# Patient Record
Sex: Female | Born: 1960 | Race: Black or African American | Hispanic: No | Smoking: Current every day smoker
Health system: Southern US, Community
[De-identification: ages and names within clinical notes are randomized; demographics above are authoritative.]

## PROBLEM LIST (undated history)

## (undated) DIAGNOSIS — F1010 Alcohol abuse, uncomplicated: Secondary | ICD-10-CM

## (undated) DIAGNOSIS — I10 Essential (primary) hypertension: Secondary | ICD-10-CM

## (undated) DIAGNOSIS — Z87891 Personal history of nicotine dependence: Secondary | ICD-10-CM

## (undated) HISTORY — PX: APPENDECTOMY: SHX54

---

## 2009-09-19 ENCOUNTER — Emergency Department (HOSPITAL_COMMUNITY): Admission: EM | Admit: 2009-09-19 | Discharge: 2009-09-19 | Payer: Self-pay | Admitting: Emergency Medicine

## 2010-11-25 LAB — URINALYSIS, ROUTINE W REFLEX MICROSCOPIC
Bilirubin Urine: NEGATIVE
Glucose, UA: NEGATIVE mg/dL
Ketones, ur: NEGATIVE mg/dL
Leukocytes, UA: NEGATIVE
pH: 6 (ref 5.0–8.0)

## 2010-11-25 LAB — POCT I-STAT, CHEM 8
BUN: 6 mg/dL (ref 6–23)
Calcium, Ion: 0.9 mmol/L — ABNORMAL LOW (ref 1.12–1.32)
Glucose, Bld: 76 mg/dL (ref 70–99)
TCO2: 27 mmol/L (ref 0–100)

## 2010-11-25 LAB — CBC
HCT: 34.3 % — ABNORMAL LOW (ref 36.0–46.0)
Hemoglobin: 12.2 g/dL (ref 12.0–15.0)
MCHC: 35.5 g/dL (ref 30.0–36.0)
RDW: 18.1 % — ABNORMAL HIGH (ref 11.5–15.5)
WBC: 8.8 10*3/uL (ref 4.0–10.5)

## 2010-11-25 LAB — URINE MICROSCOPIC-ADD ON

## 2010-11-25 LAB — DIFFERENTIAL
Basophils Absolute: 0.1 10*3/uL (ref 0.0–0.1)
Lymphocytes Relative: 31 % (ref 12–46)
Lymphs Abs: 2.8 10*3/uL (ref 0.7–4.0)
Monocytes Absolute: 0.9 10*3/uL (ref 0.1–1.0)
Monocytes Relative: 10 % (ref 3–12)
Neutro Abs: 5.1 10*3/uL (ref 1.7–7.7)

## 2010-11-25 LAB — PROTIME-INR: INR: 0.87 (ref 0.00–1.49)

## 2010-11-25 LAB — GC/CHLAMYDIA PROBE AMP, URINE
Chlamydia, Swab/Urine, PCR: NEGATIVE
GC Probe Amp, Urine: NEGATIVE

## 2010-11-25 LAB — RAPID URINE DRUG SCREEN, HOSP PERFORMED
Amphetamines: NOT DETECTED
Benzodiazepines: NOT DETECTED

## 2011-10-19 ENCOUNTER — Emergency Department (HOSPITAL_COMMUNITY)
Admission: EM | Admit: 2011-10-19 | Discharge: 2011-10-19 | Disposition: A | Discharge status: Home / Self Care | Attending: Emergency Medicine | Admitting: Emergency Medicine

## 2011-10-19 ENCOUNTER — Encounter (HOSPITAL_COMMUNITY): Payer: Self-pay

## 2011-10-19 DIAGNOSIS — R590 Localized enlarged lymph nodes: Secondary | ICD-10-CM

## 2011-10-19 DIAGNOSIS — R599 Enlarged lymph nodes, unspecified: Secondary | ICD-10-CM | POA: Insufficient documentation

## 2011-10-19 DIAGNOSIS — F17200 Nicotine dependence, unspecified, uncomplicated: Secondary | ICD-10-CM | POA: Insufficient documentation

## 2011-10-19 LAB — WET PREP, GENITAL: Yeast Wet Prep HPF POC: NONE SEEN

## 2011-10-19 LAB — PREGNANCY, URINE: Preg Test, Ur: NEGATIVE

## 2011-10-19 LAB — URINALYSIS, ROUTINE W REFLEX MICROSCOPIC
Glucose, UA: NEGATIVE mg/dL
Ketones, ur: NEGATIVE mg/dL
Leukocytes, UA: NEGATIVE
Protein, ur: 100 mg/dL — AB
Urobilinogen, UA: 0.2 mg/dL (ref 0.0–1.0)

## 2011-10-19 LAB — URINE MICROSCOPIC-ADD ON

## 2011-10-19 MED ORDER — DOXYCYCLINE HYCLATE 100 MG PO CAPS: ORAL_CAPSULE | ORAL | Status: DC

## 2011-10-19 MED ORDER — HYDROCODONE-ACETAMINOPHEN 5-325 MG PO TABS: 1.0000 | ORAL_TABLET | ORAL | Status: AC | PRN

## 2011-10-19 NOTE — ED Provider Notes (Signed)
History     CSN: [id number]  Arrival date & time 10/19/11  1946   First MD Initiated Contact with Patient 10/19/11 2000      Chief Complaint  Patient presents with  . Mass    LLQ/groin area    (Consider location/radiation/quality/duration/timing/severity/associated sxs/prior treatment) HPI Comments: Patient is a 51 year old woman has a 2 day history of pain and a lump in the left groin. So has a small pimple on the left labia minorum.  Has been no fever. She has had no prior treatment for this problem. She denies vaginal discharge.  Her boyfriend made her come for evaluation.  Patient is a 51 y.o. female presenting with groin pain.  Groin Pain This is a new problem. The current episode started 2 days ago. The problem occurs constantly. The problem has not changed since onset.Pertinent negatives include no chest pain, no abdominal pain, no headaches and no shortness of breath. Exacerbated by: Palpation. The symptoms are relieved by nothing. She has tried nothing for the symptoms.    History reviewed. No pertinent past medical history.  Past Surgical History  Procedure Date  . Appendectomy     History reviewed. No pertinent family history.  History  Substance Use Topics  . Smoking status: Current Everyday Smoker  . Smokeless tobacco: Not on file  . Alcohol Use: Yes    OB History    Grav Para Term Preterm Abortions TAB SAB Ect Mult Living                  Review of Systems  Constitutional: Negative.  Negative for fever and chills.  HENT: Negative.   Eyes: Negative.   Respiratory: Negative.  Negative for shortness of breath.   Cardiovascular: Negative for chest pain.  Gastrointestinal: Negative.  Negative for abdominal pain.  Genitourinary: Negative.   Musculoskeletal: Negative.   Neurological: Negative.  Negative for headaches.  Hematological: Positive for adenopathy.  Psychiatric/Behavioral: Negative.     Allergies  Review of patient's allergies indicates no  known allergies.  Home Medications  No current outpatient prescriptions on file.  BP 136/89  Pulse 95  Temp(Src) 97.8 F (36.6 C) (Oral)  Resp 18  SpO2 99%  Physical Exam  Constitutional: She is oriented to person, place, and time. She appears well-developed and well-nourished. No distress.  HENT:  Head: Normocephalic and atraumatic.  Neck: Normal range of motion. Neck supple.  Abdominal: Soft. Bowel sounds are normal.  Genitourinary:       She has a tender lymph node in the left groin, and a tiny pimple on the left labium minorum.  There is a clear vaginal discharge.  There is no uterine or adnexal tenderness.  Musculoskeletal: Normal range of motion.  Lymphadenopathy:    She has no cervical adenopathy.  Neurological: She is alert and oriented to person, place, and time.       No sensory or motor deficit.  Skin: Skin is warm and dry.  Psychiatric: She has a normal mood and affect. Her behavior is normal.    ED Course  Procedures (including critical care time)  Rx for inguinal adenopathy with doxycycline 100 mg bid x 2 weeks, and hydrocodone-acetaminophen every 4 hours if needed for pain.    1. Inguinal adenopathy            Carleene Cooper III, MD 10/19/11 2053

## 2011-10-19 NOTE — ED Notes (Signed)
Pt states that she has "lumps" all over. Her boyfriend thinks its cancer

## 2011-10-21 LAB — GC/CHLAMYDIA PROBE AMP, GENITAL
Chlamydia, DNA Probe: NEGATIVE
GC Probe Amp, Genital: NEGATIVE

## 2011-10-21 LAB — URINE CULTURE

## 2012-03-14 ENCOUNTER — Encounter (HOSPITAL_COMMUNITY): Payer: Self-pay | Admitting: Emergency Medicine

## 2012-03-14 ENCOUNTER — Inpatient Hospital Stay (HOSPITAL_COMMUNITY)

## 2012-03-14 ENCOUNTER — Emergency Department (HOSPITAL_COMMUNITY)

## 2012-03-14 ENCOUNTER — Inpatient Hospital Stay (HOSPITAL_COMMUNITY)
Admission: EM | Admit: 2012-03-14 | Discharge: 2012-03-16 | DRG: 440 | Disposition: A | Discharge status: Home / Self Care | Attending: Internal Medicine | Admitting: Internal Medicine

## 2012-03-14 DIAGNOSIS — K852 Alcohol induced acute pancreatitis without necrosis or infection: Secondary | ICD-10-CM

## 2012-03-14 DIAGNOSIS — R1013 Epigastric pain: Secondary | ICD-10-CM

## 2012-03-14 DIAGNOSIS — F17200 Nicotine dependence, unspecified, uncomplicated: Secondary | ICD-10-CM | POA: Diagnosis present

## 2012-03-14 DIAGNOSIS — F10230 Alcohol dependence with withdrawal, uncomplicated: Secondary | ICD-10-CM | POA: Diagnosis present

## 2012-03-14 DIAGNOSIS — I10 Essential (primary) hypertension: Secondary | ICD-10-CM

## 2012-03-14 DIAGNOSIS — K859 Acute pancreatitis without necrosis or infection, unspecified: Principal | ICD-10-CM

## 2012-03-14 DIAGNOSIS — Z87891 Personal history of nicotine dependence: Secondary | ICD-10-CM

## 2012-03-14 DIAGNOSIS — R141 Gas pain: Secondary | ICD-10-CM | POA: Diagnosis present

## 2012-03-14 DIAGNOSIS — E785 Hyperlipidemia, unspecified: Secondary | ICD-10-CM

## 2012-03-14 DIAGNOSIS — K8020 Calculus of gallbladder without cholecystitis without obstruction: Secondary | ICD-10-CM | POA: Diagnosis present

## 2012-03-14 DIAGNOSIS — E876 Hypokalemia: Secondary | ICD-10-CM

## 2012-03-14 DIAGNOSIS — F1010 Alcohol abuse, uncomplicated: Secondary | ICD-10-CM

## 2012-03-14 DIAGNOSIS — Z9089 Acquired absence of other organs: Secondary | ICD-10-CM

## 2012-03-14 DIAGNOSIS — R143 Flatulence: Secondary | ICD-10-CM | POA: Diagnosis present

## 2012-03-14 DIAGNOSIS — R142 Eructation: Secondary | ICD-10-CM | POA: Diagnosis present

## 2012-03-14 DIAGNOSIS — F1020 Alcohol dependence, uncomplicated: Secondary | ICD-10-CM | POA: Diagnosis present

## 2012-03-14 HISTORY — DX: Essential (primary) hypertension: I10

## 2012-03-14 HISTORY — DX: Personal history of nicotine dependence: Z87.891

## 2012-03-14 HISTORY — DX: Alcohol abuse, uncomplicated: F10.10

## 2012-03-14 LAB — URINE MICROSCOPIC-ADD ON

## 2012-03-14 LAB — PROTIME-INR
INR: 0.84 (ref 0.00–1.49)
Prothrombin Time: 11.7 s (ref 11.6–15.2)

## 2012-03-14 LAB — URINALYSIS, ROUTINE W REFLEX MICROSCOPIC
Glucose, UA: NEGATIVE mg/dL
Ketones, ur: 15 mg/dL — AB
Nitrite: NEGATIVE
Specific Gravity, Urine: 1.016 (ref 1.005–1.030)
pH: 7.5 (ref 5.0–8.0)

## 2012-03-14 LAB — CBC
HCT: 44.1 % (ref 36.0–46.0)
Hemoglobin: 15 g/dL (ref 12.0–15.0)
MCH: 31.5 pg (ref 26.0–34.0)
MCHC: 34 g/dL (ref 30.0–36.0)
MCV: 92.6 fL (ref 78.0–100.0)
Platelets: 343 10*3/uL (ref 150–400)
RBC: 4.76 MIL/uL (ref 3.87–5.11)
RDW: 15 % (ref 11.5–15.5)
WBC: 17.5 10*3/uL — ABNORMAL HIGH (ref 4.0–10.5)

## 2012-03-14 LAB — CBC WITH DIFFERENTIAL/PLATELET
HCT: 44.3 % (ref 36.0–46.0)
Hemoglobin: 15.6 g/dL — ABNORMAL HIGH (ref 12.0–15.0)
Lymphs Abs: 1.6 10*3/uL (ref 0.7–4.0)
Monocytes Relative: 6 % (ref 3–12)
Neutro Abs: 14 10*3/uL — ABNORMAL HIGH (ref 1.7–7.7)
Neutrophils Relative %: 84 % — ABNORMAL HIGH (ref 43–77)
RBC: 4.83 MIL/uL (ref 3.87–5.11)
WBC: 16.8 10*3/uL — ABNORMAL HIGH (ref 4.0–10.5)

## 2012-03-14 LAB — COMPREHENSIVE METABOLIC PANEL
BUN: 3 mg/dL — ABNORMAL LOW (ref 6–23)
CO2: 31 mEq/L (ref 19–32)
Calcium: 10.2 mg/dL (ref 8.4–10.5)
Creatinine, Ser: 0.44 mg/dL — ABNORMAL LOW (ref 0.50–1.10)
GFR calc Af Amer: >90 mL/min (ref >90)
GFR calc non Af Amer: >90 mL/min (ref >90)
Glucose, Bld: 226 mg/dL — ABNORMAL HIGH (ref 70–99)
Potassium: 2.5 mEq/L — CL (ref 3.5–5.1)
Sodium: 138 mEq/L (ref 135–145)
Total Protein: 7.9 g/dL (ref 6.0–8.3)

## 2012-03-14 LAB — CREATININE, SERUM
Creatinine, Ser: 0.41 mg/dL — ABNORMAL LOW (ref 0.50–1.10)
GFR calc Af Amer: >90 mL/min (ref >90)
GFR calc non Af Amer: >90 mL/min (ref >90)

## 2012-03-14 LAB — MAGNESIUM: Magnesium: 1.1 mg/dL — ABNORMAL LOW (ref 1.5–2.5)

## 2012-03-14 LAB — POCT PREGNANCY, URINE: Preg Test, Ur: NEGATIVE

## 2012-03-14 MED ORDER — ONDANSETRON HCL 4 MG/2ML IJ SOLN: 4.0000 mg | Freq: Three times a day (TID) | INTRAMUSCULAR | Status: AC | PRN

## 2012-03-14 MED ORDER — CLONIDINE HCL 0.1 MG PO TABS
0.1000 mg | ORAL_TABLET | Freq: Four times a day (QID) | ORAL | Status: DC | PRN
  Filled 2012-03-14: qty 1

## 2012-03-14 MED ORDER — CHLORDIAZEPOXIDE HCL 5 MG PO CAPS
5.0000 mg | ORAL_CAPSULE | Freq: Three times a day (TID) | ORAL | Status: DC
  Administered 2012-03-14 – 2012-03-16 (×6): 5 mg via ORAL
  Filled 2012-03-14 (×5): qty 1
  Filled 2012-03-14: qty 5

## 2012-03-14 MED ORDER — HYDROMORPHONE HCL PF 1 MG/ML IJ SOLN
1.0000 mg | INTRAMUSCULAR | Status: AC | PRN
  Administered 2012-03-14: 1 mg via INTRAVENOUS
  Filled 2012-03-14: qty 1

## 2012-03-14 MED ORDER — IOHEXOL 300 MG/ML  SOLN
20.0000 mL | INTRAMUSCULAR | Status: AC
  Administered 2012-03-14 (×2): 20 mL via ORAL

## 2012-03-14 MED ORDER — ONDANSETRON HCL 4 MG/2ML IJ SOLN: 4.0000 mg | Freq: Four times a day (QID) | INTRAMUSCULAR | Status: DC | PRN

## 2012-03-14 MED ORDER — HYDROCODONE-ACETAMINOPHEN 5-325 MG PO TABS: 1.0000 | ORAL_TABLET | ORAL | Status: DC | PRN

## 2012-03-14 MED ORDER — ONDANSETRON HCL 4 MG/2ML IJ SOLN: 4.0000 mg | Freq: Once | INTRAMUSCULAR | Status: DC

## 2012-03-14 MED ORDER — FOLIC ACID 1 MG PO TABS
1.0000 mg | ORAL_TABLET | Freq: Every day | ORAL | Status: DC
  Administered 2012-03-14 – 2012-03-16 (×3): 1 mg via ORAL
  Filled 2012-03-14 (×3): qty 1

## 2012-03-14 MED ORDER — AMLODIPINE BESYLATE 5 MG PO TABS
5.0000 mg | ORAL_TABLET | Freq: Every day | ORAL | Status: DC
  Administered 2012-03-14 – 2012-03-16 (×3): 5 mg via ORAL
  Filled 2012-03-14 (×3): qty 1

## 2012-03-14 MED ORDER — MORPHINE SULFATE 2 MG/ML IJ SOLN: 2.0000 mg | INTRAMUSCULAR | Status: DC | PRN

## 2012-03-14 MED ORDER — POTASSIUM CHLORIDE 10 MEQ/100ML IV SOLN
10.0000 meq | Freq: Once | INTRAVENOUS | Status: AC
  Administered 2012-03-14: 10 meq via INTRAVENOUS
  Filled 2012-03-14: qty 100

## 2012-03-14 MED ORDER — ONDANSETRON 4 MG PO TBDP
4.0000 mg | ORAL_TABLET | Freq: Once | ORAL | Status: DC
  Filled 2012-03-14: qty 1

## 2012-03-14 MED ORDER — ENOXAPARIN SODIUM 40 MG/0.4ML ~~LOC~~ SOLN
40.0000 mg | SUBCUTANEOUS | Status: DC
  Administered 2012-03-14: 40 mg via SUBCUTANEOUS
  Filled 2012-03-14 (×3): qty 0.4

## 2012-03-14 MED ORDER — IOHEXOL 300 MG/ML  SOLN
100.0000 mL | Freq: Once | INTRAMUSCULAR | Status: AC | PRN
  Administered 2012-03-14: 100 mL via INTRAVENOUS

## 2012-03-14 MED ORDER — SODIUM CHLORIDE 0.9 % IV SOLN: INTRAVENOUS | Status: AC

## 2012-03-14 MED ORDER — LORAZEPAM 1 MG PO TABS: 1.0000 mg | ORAL_TABLET | Freq: Four times a day (QID) | ORAL | Status: DC | PRN

## 2012-03-14 MED ORDER — MORPHINE SULFATE 4 MG/ML IJ SOLN
4.0000 mg | Freq: Once | INTRAMUSCULAR | Status: DC
  Filled 2012-03-14: qty 1

## 2012-03-14 MED ORDER — ONDANSETRON HCL 4 MG PO TABS: 4.0000 mg | ORAL_TABLET | Freq: Four times a day (QID) | ORAL | Status: DC | PRN

## 2012-03-14 MED ORDER — VITAMIN B-1 100 MG PO TABS
100.0000 mg | ORAL_TABLET | Freq: Every day | ORAL | Status: DC
  Administered 2012-03-14 – 2012-03-16 (×3): 100 mg via ORAL
  Filled 2012-03-14 (×3): qty 1

## 2012-03-14 MED ORDER — LORAZEPAM 2 MG/ML IJ SOLN: 1.0000 mg | Freq: Four times a day (QID) | INTRAMUSCULAR | Status: DC | PRN

## 2012-03-14 MED ORDER — ALBUTEROL SULFATE (5 MG/ML) 0.5% IN NEBU: 2.5000 mg | INHALATION_SOLUTION | RESPIRATORY_TRACT | Status: DC | PRN

## 2012-03-14 MED ORDER — ONDANSETRON 4 MG PO TBDP
4.0000 mg | ORAL_TABLET | Freq: Once | ORAL | Status: AC
  Administered 2012-03-14: 4 mg via ORAL

## 2012-03-14 MED ORDER — POTASSIUM CHLORIDE 10 MEQ/100ML IV SOLN
10.0000 meq | INTRAVENOUS | Status: DC
  Filled 2012-03-14 (×3): qty 100

## 2012-03-14 MED ORDER — PANTOPRAZOLE SODIUM 40 MG IV SOLR
40.0000 mg | Freq: Every day | INTRAVENOUS | Status: DC
  Administered 2012-03-14 – 2012-03-16 (×3): 40 mg via INTRAVENOUS
  Filled 2012-03-14 (×3): qty 40

## 2012-03-14 MED ORDER — MORPHINE SULFATE 4 MG/ML IJ SOLN: 4.0000 mg | Freq: Once | INTRAMUSCULAR | Status: DC

## 2012-03-14 MED ORDER — METOPROLOL TARTRATE 50 MG PO TABS
50.0000 mg | ORAL_TABLET | Freq: Two times a day (BID) | ORAL | Status: DC
  Administered 2012-03-14 – 2012-03-16 (×5): 50 mg via ORAL
  Filled 2012-03-14 (×7): qty 1

## 2012-03-14 MED ORDER — POTASSIUM CHLORIDE CRYS ER 20 MEQ PO TBCR
40.0000 meq | EXTENDED_RELEASE_TABLET | Freq: Once | ORAL | Status: AC
  Administered 2012-03-14: 40 meq via ORAL
  Filled 2012-03-14: qty 2

## 2012-03-14 MED ORDER — GUAIFENESIN-DM 100-10 MG/5ML PO SYRP
5.0000 mL | ORAL_SOLUTION | ORAL | Status: DC | PRN
  Filled 2012-03-14: qty 5

## 2012-03-14 MED ORDER — POTASSIUM CHLORIDE 10 MEQ/100ML IV SOLN
10.0000 meq | INTRAVENOUS | Status: AC
  Administered 2012-03-14 (×3): 10 meq via INTRAVENOUS
  Filled 2012-03-14 (×3): qty 100

## 2012-03-14 MED ORDER — SODIUM CHLORIDE 0.9 % IV SOLN
INTRAVENOUS | Status: AC
  Administered 2012-03-14: 15:00:00 via INTRAVENOUS

## 2012-03-14 MED ORDER — HYDRALAZINE HCL 20 MG/ML IJ SOLN
10.0000 mg | Freq: Four times a day (QID) | INTRAMUSCULAR | Status: DC | PRN
  Administered 2012-03-14: 10 mg via INTRAVENOUS
  Filled 2012-03-14 (×2): qty 0.5

## 2012-03-14 MED ORDER — MORPHINE SULFATE 4 MG/ML IJ SOLN
4.0000 mg | Freq: Once | INTRAMUSCULAR | Status: AC
  Administered 2012-03-14: 4 mg via INTRAMUSCULAR

## 2012-03-14 NOTE — ED Notes (Signed)
Patient transported to Ultrasound 

## 2012-03-14 NOTE — Progress Notes (Signed)
03/14/12 1145  OTHER  CSW Follow Up Status No follow-up needed     Unit based LCSW to consulted if disposition needs are identified and/or if psychosocial issues are noted.  Dionne Milo MSW [facility]. Weekend/Social Worker [phone]

## 2012-03-14 NOTE — ED Notes (Signed)
Pt. C/o of abdominal pain since yesterday morning. Denies nvd. States it "feels like something is stabbing her and hasn't been able to eat".

## 2012-03-14 NOTE — ED Provider Notes (Signed)
History     CSN: [id number]  Arrival date & time 03/14/12  0821   First MD Initiated Contact with Patient 03/14/12 [phone]      Chief Complaint  Patient presents with  . Abdominal Pain    (Consider location/radiation/quality/duration/timing/severity/associated sxs/prior treatment) HPI History from patient. 51 year old female who presents with abdominal pain. Pain is described as sharp and stabbing in his located to the upper abdomen bilaterally, worse on the right side. It is constant and worsens with palpation or movement. No medications taken history this at home. She denies any associated nausea, vomiting, diarrhea, urinary symptoms, vaginal bleeding or discharge. She has not had fever or chills. She does report reduced appetite secondary to pain. Patient reports that she was seen at another emergency department about 3 years ago and ended up being hospitalized for possible pancreatitis. She does endorse drinking approximately 3 mixed drinks daily on a regular basis. Surgical history is pertinent for appendectomy.  Past Medical History  Diagnosis Date  . Hypertension     Past Surgical History  Procedure Date  . Appendectomy     No family history on file.  History  Substance Use Topics  . Smoking status: Current Everyday Smoker  . Smokeless tobacco: Not on file  . Alcohol Use: Yes    OB History    Grav Para Term Preterm Abortions TAB SAB Ect Mult Living                  Review of Systems  Constitutional: Positive for appetite change. Negative for fever and chills.  Respiratory: Negative for cough and shortness of breath.   Cardiovascular: Negative for chest pain.  Gastrointestinal: Positive for abdominal pain. Negative for nausea, vomiting, diarrhea, constipation, blood in stool, abdominal distention, anal bleeding and rectal pain.  Genitourinary: Negative for dysuria, vaginal bleeding and vaginal discharge.  Musculoskeletal: Negative for myalgias.  Skin: Negative for  color change and rash.  Neurological: Negative for dizziness and weakness.  All other systems reviewed and are negative.    Allergies  Review of patient's allergies indicates no known allergies.  Home Medications   Current Outpatient Rx  Name Route Sig Dispense Refill  . DOXYCYCLINE HYCLATE 100 MG PO CAPS  Take one capsule by mouth twice a day for two weeks. 28 capsule 0    BP 200/112  Pulse 87  Temp 98.2 F (36.8 C)  SpO2 96%  Physical Exam  Nursing note and vitals reviewed. Constitutional: She appears well-developed and well-nourished. No distress.  HENT:  Head: Normocephalic and atraumatic.  Eyes:       Normal appearance  Neck: Normal range of motion.  Cardiovascular: Normal rate, regular rhythm and normal heart sounds.   Pulmonary/Chest: Effort normal and breath sounds normal. She exhibits no tenderness.  Abdominal: Soft. Bowel sounds are normal. There is tenderness.       Tenderness to palpation to the epigastrium and right upper quadrant with guarding. No rebound.  Musculoskeletal: Normal range of motion.  Neurological: She is alert.  Skin: Skin is warm and dry. She is not diaphoretic.  Psychiatric: She has a normal mood and affect.    ED Course  Procedures (including critical care time)  Labs Reviewed  LIPASE, BLOOD - Abnormal; Notable for the following:    Lipase 2098 (*)     All other components within normal limits  CBC WITH DIFFERENTIAL - Abnormal; Notable for the following:    WBC 16.8 (*)     Hemoglobin 15.6 (*)  Neutrophils Relative 84 (*)     Neutro Abs 14.0 (*)     Lymphocytes Relative 10 (*)     Monocytes Absolute 1.1 (*)     All other components within normal limits  COMPREHENSIVE METABOLIC PANEL - Abnormal; Notable for the following:    Potassium 2.5 (*)     Chloride 90 (*)     Glucose, Bld 226 (*)     BUN 3 (*)     Creatinine, Ser 0.44 (*)     All other components within normal limits  URINALYSIS, ROUTINE W REFLEX MICROSCOPIC -  Abnormal; Notable for the following:    APPearance CLOUDY (*)     Hgb urine dipstick MODERATE (*)     Ketones, ur 15 (*)     Protein, ur 100 (*)     Leukocytes, UA TRACE (*)     All other components within normal limits  POCT PREGNANCY, URINE  URINE MICROSCOPIC-ADD ON   US Abdomen Complete  03/14/2012  *RADIOLOGY REPORT*  Clinical Data:  Epigastric and right upper quadrant abdominal pain. Elevated lipase.  COMPLETE ABDOMINAL ULTRASOUND  Comparison:  None.  Findings:  Gallbladder:  6 mm mobile gallstone in the gallbladder.  No gallbladder wall thickening or pericholecystic fluid.  The patient was not tender over the gallbladder.  Common bile duct:  Mildly dilated, measuring 6.0 mm in diameter proximally.  Liver:  Mildly echogenic and inhomogeneous.  IVC:  Appears normal.  Pancreas:  Mildly dilated pancreatic duct, measuring 3.1 mm in diameter.  The pancreatic head and tail are suboptimally visualized.  Spleen:  Poorly visualized, measuring 2.4 cm in length.  Right Kidney:  Normal, measuring 11.5 cm in length.  Left Kidney:  Normal, measuring 11.7 cm in length.  Abdominal aorta:  Obscured by overlying bowel gas distally.  The visualized portions are normal in caliber.  IMPRESSION:  1.  Cholelithiasis without evidence of cholecystitis. 2.  Mildly dilated common duct and pancreatic duct.  ERCP may provide useful additional information regarding the cause of the ductal dilatation. 3.  Mildly echogenic and heterogeneous liver, possibly due to steatosis. 4.  Poorly visualized pancreatic head and tail.  Pancreatitis is a possibility.  Original Report Authenticated By: Darrol Angel, M.D.     No diagnosis found.    MDM  Patient presents with epigastric abdominal pain since yesterday. No associated nausea or vomiting. She is nontoxic appearing with normal vital signs. Her lipase is quite elevated at 2098. Her LFTs are normal. She is also hypokalemic with a potassium of 2.5. Patient will be admitted for  these issues. An ultrasound shows cholelithiasis without cholecystitis and evidence of possible pancreatitis. No mention on the radiology report of any abscesses or cysts. Discussed with Dr. Thedore Mins [facility] who [facility] the patient for admission. Case discussed with PA with [facility] as well - they will consult for need for ER/MRCP.        Grant Fontana, PA-C 03/14/12 1417

## 2012-03-14 NOTE — H&P (Signed)
[facility]                                                                                    Patient Demographics  Lisa Blanchard, is a 51 y.o. female  CSN: [id number]  MRN: [id number]  DOB - December 31, 1960  Admit Date - 03/14/2012  Outpatient Primary MD for the patient is DEFAULT,PROVIDER, MD   With History of -  Past Medical History  Diagnosis Date  . Hypertension   . Alcohol abuse 03/14/2012  . HTN (hypertension) 03/14/2012  . Smoking hx 03/14/2012      Past Surgical History  Procedure Date  . Appendectomy     in for   Chief Complaint  Patient presents with  . Abdominal Pain     HPI  Lisa Blanchard  is a 51 y.o. female, with history of hypertension, ongoing alcohol and smoking abuse for which she has been counseled to quit both, who has had one episode of alcoholic pancreatitis a few years ago at a [facility] in [place]. Patient presents to the ER with a one-week history of epigastric abdominal pain which is sharp in nature and radiating to her back, worse with food better with bowel rest and pain medications, not associated with any other symptoms, pain is intermittent, came to the ER where she was diagnosed with acute pancreatitis and I was called to admit the patient. She denies fever chills, denies nausea vomiting, she does say that her appetite is less for the last 1 week, also says that pain actually got better after the initial episode one week ago but since yesterday abruptly got worse again.    Review of Systems    In addition to the HPI above,  No Fever-chills, No Headache, No changes with Vision or hearing, No problems swallowing food or Liquids, No Chest pain, Cough or Shortness of Breath, No Nausea or Vommitting, Bowel movements are regular, No Blood in stool or Urine, No dysuria, No new skin rashes or bruises, No new joints pains-aches,  No new weakness, tingling, numbness in any extremity, No recent weight gain or loss, No  polyuria, polydypsia or polyphagia, No significant Mental Stressors.  A full 10 point Review of Systems was done, except as stated above, all other Review of Systems were negative.   Social History History  Substance Use Topics  . Smoking status: Current Everyday Smoker  . Smokeless tobacco:  yes   . Alcohol Use: Yes     Family History No family history of antral titers or pancreatic cancer  Prior to Admission medications   Not on File    No Known Allergies  Physical Exam  Vitals  Blood pressure 166/97, pulse 90, temperature 98.2 F (36.8 C), resp. rate 18, SpO2 98.00%.   1. General middle-aged African American female lying in bed in NAD,    2. Normal affect and insight, Not Suicidal or Homicidal, Awake Alert, Oriented *3.  3. No F.N deficits, ALL C.Nerves Intact, Strength 5/5 all 4 extremities, Sensation intact all 4 extremities, Plantars down going.  4. Ears and Eyes appear Normal, Conjunctivae clear, PERRLA. Moist Oral Mucosa.  5. Supple Neck, No JVD, No cervical  lymphadenopathy appriciated, No Carotid Bruits.  6. Symmetrical Chest wall movement, Good air movement bilaterally, CTAB.  7. RRR, No Gallops, Rubs or Murmurs, No Parasternal Heave.  8. Positive Bowel Sounds, Abdomen Soft, Non tender, No organomegaly appriciated,       No rebound -guarding or rigidity.  9.  No Cyanosis, Normal Skin Turgor, No Skin Rash or Bruise.  10. Good muscle tone,  joints appear normal , no effusions, Normal ROM.  11. No Palpable Lymph Nodes in Neck or Axillae     Data Review  CBC  Lab 03/14/12 0851  WBC 16.8*  HGB 15.6*  HCT 44.3  PLT 359  MCV 91.7  MCH 32.3  MCHC 35.2  RDW 14.7  LYMPHSABS 1.6  MONOABS 1.1*  EOSABS 0.0  BASOSABS 0.0  BANDABS --   ------------------------------------------------------------------------------------------------------------------  Chemistries   Lab 03/14/12 0851  NA 138  K 2.5*  CL 90*  CO2 31  GLUCOSE 226*  BUN 3*    CREATININE 0.44*  CALCIUM 10.2  MG --  AST 17  ALT 8  ALKPHOS 77  BILITOT 0.3   ------------------------------------------------------------------------------------------------------------------ CrCl is unknown because there is no height on file for the current visit. ------------------------------------------------------------------------------------------------------------------ No results found for this basename: TSH,T4TOTAL,FREET3,T3FREE,THYROIDAB in the last 72 hours   Coagulation profile No results found for this basename: INR:5,PROTIME:5 in the last 168 hours ------------------------------------------------------------------------------------------------------------------- No results found for this basename: DDIMER:2 in the last 72 hours -------------------------------------------------------------------------------------------------------------------  Cardiac Enzymes No results found for this basename: CK:3,CKMB:3,TROPONINI:3,MYOGLOBIN:3 in the last 168 hours ------------------------------------------------------------------------------------------------------------------ No components found with this basename: POCBNP:3   ---------------------------------------------------------------------------------------------------------------  Urinalysis    Component Value Date/Time   COLORURINE YELLOW 03/14/2012 0951   APPEARANCEUR CLOUDY* 03/14/2012 0951   LABSPEC 1.016 03/14/2012 0951   PHURINE 7.5 03/14/2012 0951   GLUCOSEU NEGATIVE 03/14/2012 0951   HGBUR MODERATE* 03/14/2012 0951   BILIRUBINUR NEGATIVE 03/14/2012 0951   KETONESUR 15* 03/14/2012 0951   PROTEINUR 100* 03/14/2012 0951   UROBILINOGEN 0.2 03/14/2012 0951   NITRITE NEGATIVE 03/14/2012 0951   LEUKOCYTESUR TRACE* 03/14/2012 0951     Imaging results:   US Abdomen Complete  03/14/2012  *RADIOLOGY REPORT*  Clinical Data:  Epigastric and right upper quadrant abdominal pain. Elevated lipase.  COMPLETE ABDOMINAL ULTRASOUND  Comparison:   None.  Findings:  Gallbladder:  6 mm mobile gallstone in the gallbladder.  No gallbladder wall thickening or pericholecystic fluid.  The patient was not tender over the gallbladder.  Common bile duct:  Mildly dilated, measuring 6.0 mm in diameter proximally.  Liver:  Mildly echogenic and inhomogeneous.  IVC:  Appears normal.  Pancreas:  Mildly dilated pancreatic duct, measuring 3.1 mm in diameter.  The pancreatic head and tail are suboptimally visualized.  Spleen:  Poorly visualized, measuring 2.4 cm in length.  Right Kidney:  Normal, measuring 11.5 cm in length.  Left Kidney:  Normal, measuring 11.7 cm in length.  Abdominal aorta:  Obscured by overlying bowel gas distally.  The visualized portions are normal in caliber.  IMPRESSION:  1.  Cholelithiasis without evidence of cholecystitis. 2.  Mildly dilated common duct and pancreatic duct.  ERCP may provide useful additional information regarding the cause of the ductal dilatation. 3.  Mildly echogenic and heterogeneous liver, possibly due to steatosis. 4.  Poorly visualized pancreatic head and tail.  Pancreatitis is a possibility.  Original Report Authenticated By: Darrol Angel, M.D.      Assessment & Plan   1. Acute alcoholic pancreatitis - doubt there is  gallstone informant, her liver enzymes including total bilirubin are stable. She will be admitted on the medical floor, IV fluids bowel rest, narcotics for pain control, GI to see, will check a triglyceride level in the morning, doubt she will require ERCP or MRCP.   2. Ongoing alcohol and smoking abuse. Patient counseled to quit both, folic acid and thiamine supplementation, scheduled Librium along with IV Ativan for withdrawal or CIWA protocol.   3. Hypertension patient does not take any medications and is in poor control. We'll place her on Lopressor and Norvasc along with when necessary Catapres and monitor.   4. Hypokalemia will be replaced and rechecked.   DVT Prophylaxis Lovenox     AM Labs Ordered, also please review Full Orders   Admission, patients condition and plan of care including tests being ordered have been discussed with the patient  who indicates understanding and agree with the plan and Code Status.  Code Status Full  Condition Marinell Blight K M.D on 03/14/2012 at 2:19 PM  Between 7am to 7pm - Pager - [phone]  After 7pm go to [email or web address] - password [email or web address]  And look for the night coverage person covering me after hours  [facility] Office  [phone]

## 2012-03-14 NOTE — Progress Notes (Signed)
03/14/12 1144  Discharge Planning  Type of Residence Private residence  Living Arrangements Spouse/significant other  Support Systems Spouse/significant other  Do you have any problems obtaining your medications? No  Family/patient expects to be discharged to: Private residence  Once you are discharged, how will you get to your follow-up appointment? Family;Self  Expected Discharge Date 03/18/12  Case Management Consult Needed No  Social Work Consult Needed No

## 2012-03-14 NOTE — Consult Note (Signed)
Referring Provider: [facility] Primary Care Physician:  Sheila Oats, MD Primary Gastroenterologist:  none  Reason for Consultation: Pancreatitis  HPI: Lisa Blanchard is a 51 y.o. female was admitted earlier today through the emergency room after presenting with acute onset of epigastric pain. She is otherwise generally healthy with history of hypertension. Apparently she also does use alcohol on a daily basis admitting to 3 drinks per day.( her friend says one pint per day), and has done so for many years  Workup in the emergency room with upper abdominal ultrasound is positive for a 6 mm gallstone in the gallbladder no gallbladder wall thickening common bile duct is slightly dilated at 6 mm liver spelled to be in homogeneous, pancreas not well seen but the pancreatic duct is slightly elevated as well. Admitting labs show WBC of 16.8 hemoglobin 15.6 potassium was low at 2.5 liver function study tests are normal and lipase is significantly elevated at 2098 . Patient relates that she did have a mild episode of pancreatitis about 3 years ago at which time she was in [place]. It sounds like she was seen in the emergency room there given pain medication and a diagnosis but did not require admission. Patient says she has been doing well and has had no GI issues in the past 3 years until onset about one week ago of upper abdominal discomfort which was reminiscent of prior episode of pancreatitis. She says she felt better for a couple of days and then yesterday morning had onset of much more intense pain radiating into her back which she describes as "10 of 10". In the emergency room this morning she did have some nausea and vomiting though she says she is not been vomiting at home. She has not had any documented fever chills or sweats no diarrhea or melena. She says her appetite has been decreased over the past week and she has felt worse with any by mouth intake. Of note; both she and her friend states  that she has had developed abdominal swelling and distention over about the past 5 months, she has not had any change in her weight.  Patient has not had any prior history of liver disease that she is aware of and family history is negative as well   Past Medical History  Diagnosis Date  . Hypertension     Past Surgical History  Procedure Date  . Appendectomy     Prior to Admission medications   Not on File    Current Facility-Administered Medications  Medication Dose Route Frequency Provider Last Rate Last Dose  . HYDROmorphone (DILAUDID) injection 1 mg  1 mg Intravenous Q4H PRN Grant Fontana, PA-C   1 mg at 03/14/12 1320  . morphine 4 MG/ML injection 4 mg  4 mg Intramuscular Once Grant Fontana, PA-C   4 mg at 03/14/12 0911  . ondansetron (ZOFRAN-ODT) disintegrating tablet 4 mg  4 mg Oral Once Grant Fontana, PA-C   4 mg at 03/14/12 0911  . potassium chloride 10 mEq in 100 mL IVPB  10 mEq Intravenous Once Grant Fontana, PA-C   10 mEq at 03/14/12 1039  . potassium chloride 10 mEq in 100 mL IVPB  10 mEq Intravenous Q1 Hr x 4 Leroy Sea, MD      . potassium chloride SA (K-DUR,KLOR-CON) CR tablet 40 mEq  40 mEq Oral Once Grant Fontana, PA-C   40 mEq at 03/14/12 1039  . DISCONTD: morphine 4 MG/ML injection 4 mg  4 mg Intravenous Once  Grant Fontana, PA-C      . DISCONTD: morphine 4 MG/ML injection 4 mg  4 mg Intramuscular Once Grant Fontana, PA-C      . DISCONTD: ondansetron [facility]) injection 4 mg  4 mg Intravenous Once Grant Fontana, PA-C      . DISCONTD: ondansetron (ZOFRAN-ODT) disintegrating tablet 4 mg  4 mg Oral Once Grant Fontana, PA-C        Allergies as of 03/14/2012  . (No Known Allergies)    No family history on file.  History   Social History  . Marital Status: Divorced    Spouse Name: N/A    Number of Children: N/A  . Years of Education: N/A   Occupational History  . Not on file.   Social History Main Topics    . Smoking status: Current Everyday Smoker  . Smokeless tobacco: Not on file  . Alcohol Use: Yes  . Drug Use: No  . Sexually Active:    Other Topics Concern  . Not on file   Social History Narrative  . No narrative on file    Review of Systems: Pertinent positive and negative review of systems were noted in the above HPI section.  All other review of systems was otherwise negative.Marland Kitchen  Physical Exam: Vital signs in last 24 hours: Temp:  [98.2 F (36.8 C)] 98.2 F (36.8 C) (07/06 0828) Pulse Rate:  [73-90] 90  (07/06 1325) Resp:  [18] 18  (07/06 1126) BP: (166-211)/(88-120) 166/97 mmHg (07/06 1325) SpO2:  [94 %-98 %] 98 % (07/06 1325)   General:   Alert,  Well-developed, well-nourished, pleasant and cooperative in NAD Head:  Normocephalic and atraumatic. Eyes:  Sclera clear, no icterus.   Conjunctiva pink. Ears:  Normal auditory acuity. Nose:  No deformity, discharge,  or lesions. Mouth:  No deformity or lesions.   Neck:  Supple; no masses or thyromegaly. Lungs:  Clear throughout to auscultation.   No wheezes, crackles, or rhonchi. Heart:  Regular rate and rhythm; no murmurs, clicks, rubs,  or gallops. Abdomen:  Soft, protuberant and full filling though no definite fluid wave. She is tender in the epigastrium and right upper quadrant no palpable mass or palpable hepatosplenomegaly bowel sounds are present he does have guarding no rebound   Rectal:  Deferred  Msk:  Symmetrical without gross deformities. . Pulses:  Normal pulses noted. Extremities:  Without clubbing or edema. Very thin Neurologic:  Alert and  oriented x4;  grossly normal neurologically. Skin:  Intact without significant lesions or rashes.. Psych:  Alert and cooperative. Normal mood and affect.  Intake/Output from previous day:   Intake/Output this shift:    Lab Results:  Basename 03/14/12 0851  WBC 16.8*  HGB 15.6*  HCT 44.3  PLT 359   BMET  Basename 03/14/12 0851  NA 138  K 2.5*  CL 90*  CO2  31  GLUCOSE 226*  BUN 3*  CREATININE 0.44*  CALCIUM 10.2   LFT  Basename 03/14/12 0851  PROT 7.9  ALBUMIN 4.0  AST 17  ALT 8  ALKPHOS 77  BILITOT 0.3  BILIDIR --  IBILI --          Studies/Results: US Abdomen Complete  03/14/2012  *RADIOLOGY REPORT*  Clinical Data:  Epigastric and right upper quadrant abdominal pain. Elevated lipase.  COMPLETE ABDOMINAL ULTRASOUND  Comparison:  None.  Findings:  Gallbladder:  6 mm mobile gallstone in the gallbladder.  No gallbladder wall thickening or pericholecystic fluid.  The patient was not tender  over the gallbladder.  Common bile duct:  Mildly dilated, measuring 6.0 mm in diameter proximally.  Liver:  Mildly echogenic and inhomogeneous.  IVC:  Appears normal.  Pancreas:  Mildly dilated pancreatic duct, measuring 3.1 mm in diameter.  The pancreatic head and tail are suboptimally visualized.  Spleen:  Poorly visualized, measuring 2.4 cm in length.  Right Kidney:  Normal, measuring 11.5 cm in length.  Left Kidney:  Normal, measuring 11.7 cm in length.  Abdominal aorta:  Obscured by overlying bowel gas distally.  The visualized portions are normal in caliber.  IMPRESSION:  1.  Cholelithiasis without evidence of cholecystitis. 2.  Mildly dilated common duct and pancreatic duct.  ERCP may provide useful additional information regarding the cause of the ductal dilatation. 3.  Mildly echogenic and heterogeneous liver, possibly due to steatosis. 4.  Poorly visualized pancreatic head and tail.  Pancreatitis is a possibility.  Original Report Authenticated By: Darrol Angel, M.D.    IMPRESSION:  #72 51 year old female admitted today with acute pancreatitis with leukocytosis but no other worrisome parameters Liver tests are normal. At this time it is unclear whether her pancreatitis is secondary to EtOH versus gallbladder disease though suspect EtOH related  #2 new onset of abdominal distention and swelling x4-5 months-rule out ascites/malignancy #3  hypertension poorly controlled #4 hypokalemia- corrected  PLAN: #1 bowel rest, n.p.o. except ice chips #2 liberal IV fluids #3 pain control and anti-emetics #4 watch for EtOH withdrawal #5 IV Protonix every 24 hours #6 schedule for CT scan of the abdomen and pelvis for further evaluation of the pancreatitis and to rule out underlying cirrhosis/ascites (not seen on ultrasound) versus malignancy   Amy Esterwood  03/14/2012, 1:59 PM  GI ATTENDING  History, laboratories, x-rays reviewed. Patient seen and examined. Agree with above H&P. Patient presents with acute pancreatitis. Etiology likely alcohol. Has prior history of the same. Exam reveals tender epigastric area. Otherwise negative. No acute distress.. Continues to drink. Other issues include hypertension and hypokalemia. Abdominal distention and swelling of 4-5 months duration noted. No fluid on ultrasound. Question dilated pancreatic duct raised. Agree with plans for CT scan of the abdomen with attention to the pancreas to exclude occult mass. Otherwise, aggressive hydration, i correction, and pain control. I discussed with the patient the importance of complete abstinence from alcohol. She agreed. We will see.Marland KitchenMarland KitchenWill follow  Wilhemina Bonito. Eda Keys., M.D. [facility] Division of Gastroenterology

## 2012-03-15 DIAGNOSIS — E785 Hyperlipidemia, unspecified: Secondary | ICD-10-CM | POA: Diagnosis present

## 2012-03-15 LAB — LIPID PANEL
Cholesterol: 295 mg/dL — ABNORMAL HIGH (ref 0–200)
LDL Cholesterol: 200 mg/dL — ABNORMAL HIGH (ref 0–99)
VLDL: 17 mg/dL (ref 0–40)

## 2012-03-15 LAB — COMPREHENSIVE METABOLIC PANEL
Albumin: 3.3 g/dL — ABNORMAL LOW (ref 3.5–5.2)
Alkaline Phosphatase: 64 U/L (ref 39–117)
BUN: 3 mg/dL — ABNORMAL LOW (ref 6–23)
CO2: 27 mEq/L (ref 19–32)
Chloride: 97 mEq/L (ref 96–112)
GFR calc Af Amer: >90 mL/min (ref >90)
Glucose, Bld: 133 mg/dL — ABNORMAL HIGH (ref 70–99)
Potassium: 3.2 mEq/L — ABNORMAL LOW (ref 3.5–5.1)
Total Bilirubin: 0.3 mg/dL (ref 0.3–1.2)

## 2012-03-15 LAB — CBC
Hemoglobin: 13.8 g/dL (ref 12.0–15.0)
MCH: 31.4 pg (ref 26.0–34.0)
MCHC: 34.2 g/dL (ref 30.0–36.0)

## 2012-03-15 MED ORDER — POTASSIUM CHLORIDE CRYS ER 20 MEQ PO TBCR
20.0000 meq | EXTENDED_RELEASE_TABLET | Freq: Two times a day (BID) | ORAL | Status: DC
  Administered 2012-03-15 – 2012-03-16 (×3): 20 meq via ORAL
  Filled 2012-03-15 (×4): qty 1

## 2012-03-15 MED ORDER — CLONIDINE HCL 0.1 MG PO TABS
0.1000 mg | ORAL_TABLET | Freq: Two times a day (BID) | ORAL | Status: DC
  Administered 2012-03-15 – 2012-03-16 (×3): 0.1 mg via ORAL
  Filled 2012-03-15 (×4): qty 1

## 2012-03-15 MED ORDER — SIMVASTATIN 20 MG PO TABS
20.0000 mg | ORAL_TABLET | Freq: Every day | ORAL | Status: DC
  Administered 2012-03-15: 20 mg via ORAL
  Filled 2012-03-15 (×2): qty 1

## 2012-03-15 NOTE — Progress Notes (Signed)
Patient ID: Lisa Blanchard, female   DOB: 1961/07/20, 51 y.o.   MRN: [id number] [facility] Progress Note  Subjective: Feels better-pain not as severe, no vomiting except after contrast last pm.  Objective:  Vital signs in last 24 hours: Temp:  [97.5 F (36.4 C)-98.7 F (37.1 C)] 98.7 F (37.1 C) (07/07 0635) Pulse Rate:  [73-91] 80  (07/07 0635) Resp:  [18] 18  (07/07 0635) BP: (153-211)/(88-120) 153/94 mmHg (07/07 0635) SpO2:  [94 %-98 %] 96 % (07/07 0635) Weight:  [115 lb 4.8 oz (52.3 kg)-117 lb 4.6 oz (53.2 kg)] 117 lb 4.6 oz (53.2 kg) (07/07 0635) Last BM Date: 03/12/12 General:   Alert,  Well-developed,    in NAD Heart:  Regular rate and rhythm; no murmurs Pulm;clear Abdomen:  Soft, tender across upper abdomen and abdomen protuberant. Normal bowel sounds,+ guarding, and without rebound.   Extremities:  Without edema. Neurologic:  Alert and  oriented x4;  grossly normal neurologically. Psych:  Alert and cooperative. Normal mood and affect.  Intake/Output from previous day: 07/06 0701 - 07/07 0700 In: 2775 [I.V.:2475; IV Piggyback:300] Out: -  Intake/Output this shift:    Lab Results:  Lipase  Down to 1100 range  Basename 03/15/12 0658 03/14/12 1419 03/14/12 0851  WBC 15.6* 17.5* 16.8*  HGB 13.8 15.0 15.6*  HCT 40.4 44.1 44.3  PLT 340 343 359   BMET  Basename 03/15/12 0658 03/14/12 1825 03/14/12 1419 03/14/12 0851  NA 138 -- -- 138  K 3.2* 3.3* -- 2.5*  CL 97 -- -- 90*  CO2 27 -- -- 31  GLUCOSE 133* -- -- 226*  BUN 3* -- -- 3*  CREATININE 0.40* -- 0.41* 0.44*  CALCIUM 8.9 -- -- 10.2   LFT  Basename 03/15/12 0658  PROT 7.0  ALBUMIN 3.3*  AST 13  ALT 6  ALKPHOS 64  BILITOT 0.3  BILIDIR --  IBILI --   PT/INR  Basename 03/14/12 1419  LABPROT 11.7  INR 0.84    Assessment / Plan: #1 51 yo female with acute pancreatitis- CT reviewed and shows acute pancreatitis, no ductal dilation,no chronic pancreatitis-2 small incidental gallstones.  Pancreatitis is secondary to ETOH I discussed this with pt this morning, and advised complete abstinence, and involvement in AA on discharge. Would  also consider an inpt program if her insurance will cover- ask social worker to see  Management is supportive, she has no worrisome parameters- allow sips of clears today #2 Hypokalemia - replace, check phosphorus if not done #3 htn   we will sign off ,call for problems.  Principal Problem:  *Alcohol abuse Active Problems:  Acute alcoholic pancreatitis  Smoking hx  HTN (hypertension)  Hypokalemia  Abdominal pain, epigastric     LOS: 1 day   Amy Esterwood  03/15/2012, 10:20 AM    GI ATTENDING  Patient seen and examined. Laboratories and CT scan reviewed. Laboratory stable. Still a bit hypokalemic. Primary service correcting. CT scan shows interstitial pancreatitis. No complicating features. No mass. She is feeling significantly better. Exam has improved. Continue with hydration, correction of potassium, and advance diet as tolerated. Pain control as needed. COMPLETE ABSTINENCE FROM ALCOHOL HAS BEEN STRONGLY EMPHASIZED. We're available as needed. Thank you. Will sign off.  Wilhemina Bonito. Eda Keys., M.D. [facility] Division of Gastroenterology

## 2012-03-15 NOTE — ED Provider Notes (Signed)
Medical screening examination/treatment/procedure(s) were conducted as a shared visit with non-physician practitioner(s) and myself.  I personally evaluated the patient during the encounter  C/O abdominal pain. +epigastric ttp. No R/G. Found to have pancreatitis with dilatation of pancreatic duct. GI consulted. Admitted to hosp.  Forbes Cellar, MD 03/15/12 [phone]

## 2012-03-15 NOTE — Progress Notes (Signed)
[facility] PROGRESS NOTE  Lisa Blanchard OZH:[id number] DOB: 1961/04/22 DOA: 03/14/2012 PCP: Sheila Oats, MD  HPI/Subjective: Abdominal Pain better today, still on clears, IV fluids. CIWA is normal  Objective: Filed Vitals:   03/15/12 0020 03/15/12 0210 03/15/12 0400 03/15/12 0635  BP: 160/94 153/88  153/94  Pulse: 91 81 82 80  Temp:  98.3 F (36.8 C)  98.7 F (37.1 C)  TempSrc:  Oral  Oral  Resp:  18  18  Height:      Weight:    53.2 kg (117 lb 4.6 oz)  SpO2:  94%  96%    Intake/Output Summary (Last 24 hours) at 03/15/12 1247 Last data filed at 03/15/12 0700  Gross per 24 hour  Intake   2775 ml  Output      0 ml  Net   2775 ml    Exam:   General:  Weak appearing but NAD  Cardiovascular: RRR, no mrg  Respiratory: CTAB  Abdomen: mild TTP, mildly distended/tympanic, has BS  Data Reviewed: Basic Metabolic Panel:  Lab 03/15/12 6295 03/14/12 1825 03/14/12 1419 03/14/12 0851  NA 138 -- -- 138  K 3.2* 3.3* -- 2.5*  CL 97 -- -- 90*  CO2 27 -- -- 31  GLUCOSE 133* -- -- 226*  BUN 3* -- -- 3*  CREATININE 0.40* -- 0.41* 0.44*  CALCIUM 8.9 -- -- 10.2  MG -- 1.1* -- --  PHOS -- -- -- --   Liver Function Tests:  Lab 03/15/12 0658 03/14/12 0851  AST 13 17  ALT 6 8  ALKPHOS 64 77  BILITOT 0.3 0.3  PROT 7.0 7.9  ALBUMIN 3.3* 4.0    Lab 03/15/12 0658 03/14/12 0851  LIPASE 1127* 2098*  AMYLASE -- --   No results found for this basename: AMMONIA:5 in the last 168 hours CBC:  Lab 03/15/12 0658 03/14/12 1419 03/14/12 0851  WBC 15.6* 17.5* 16.8*  NEUTROABS -- -- 14.0*  HGB 13.8 15.0 15.6*  HCT 40.4 44.1 44.3  MCV 91.8 92.6 91.7  PLT 340 343 359      Studies: Ct Abdomen Pelvis W Wo Contrast  03/14/2012  *RADIOLOGY REPORT*  Clinical Data: Acute pancreatitis., elevated lipase and white blood cell count  CT ABDOMEN AND PELVIS WITHOUT AND WITH CONTRAST  Technique:  Multidetector CT imaging of the abdomen and pelvis was performed without contrast  material in one or both body regions, followed by contrast material(s) and further sections in one or both body regions.  Contrast: OMNIPAQUE IOHEXOL 300 MG/ML  SOLN  Comparison: Abdominal ultrasound 03/14/2012  Findings: Mild linear atelectasis at the posterior lung bases.  No pleural fluid.  No pericardial fluid.  There is no focal hepatic lesion.  No intrahepatic or extrahepatic biliary ductal dilatation.  There are two small gallstones within the gallbladder measuring less than 5 mm.  No evidence of gallbladder inflammation.  The pancreas is edematous and there is fluid along the pancreas head, body and tail.  This fluid extends along the left and right anterior pararenal fascia and into the pelvis.  These findings are consistent acute pancreatitis.  Pancreatic parenchyma enhances uniformly.  There is no organized fluid collection.  No evidence of vascular complication involving the celiac or SMA arteries.  The common bile duct is not identified.  No evidence of common bile duct stone.  The spleen is small.  The adrenal glands and kidneys are normal. The stomach, duodenum, small bowel, and cecum normal.  There are surgical clips in  the inferior margin of the cecum which may indicate prior appendectomy.  The colon is predominately collapsed.  Abdominal aorta is normal caliber.  No retroperitoneal or periportal lymphadenopathy.  There is moderate amount of free fluid the pelvis which has simple attenuation.  The ovaries and uterus are normal.  Bladder is normal.  No pelvic lymphadenopathy. Review of  bone windows demonstrates no aggressive osseous lesions.  IMPRESSION:  1.  Acute pancreatitis.  No organized fluid collection or vascular complication.  No evidence of necrosis. 2.  No evidence of common bile duct stone or ductal dilatation to suggest gallstone pancreatitis although there are several small gallstones in the gallbladder.  Original Report Authenticated By: Genevive Bi, M.D.   US Abdomen  Complete  03/14/2012  *RADIOLOGY REPORT*  Clinical Data:  Epigastric and right upper quadrant abdominal pain. Elevated lipase.  COMPLETE ABDOMINAL ULTRASOUND  Comparison:  None.  Findings:  Gallbladder:  6 mm mobile gallstone in the gallbladder.  No gallbladder wall thickening or pericholecystic fluid.  The patient was not tender over the gallbladder.  Common bile duct:  Mildly dilated, measuring 6.0 mm in diameter proximally.  Liver:  Mildly echogenic and inhomogeneous.  IVC:  Appears normal.  Pancreas:  Mildly dilated pancreatic duct, measuring 3.1 mm in diameter.  The pancreatic head and tail are suboptimally visualized.  Spleen:  Poorly visualized, measuring 2.4 cm in length.  Right Kidney:  Normal, measuring 11.5 cm in length.  Left Kidney:  Normal, measuring 11.7 cm in length.  Abdominal aorta:  Obscured by overlying bowel gas distally.  The visualized portions are normal in caliber.  IMPRESSION:  1.  Cholelithiasis without evidence of cholecystitis. 2.  Mildly dilated common duct and pancreatic duct.  ERCP may provide useful additional information regarding the cause of the ductal dilatation. 3.  Mildly echogenic and heterogeneous liver, possibly due to steatosis. 4.  Poorly visualized pancreatic head and tail.  Pancreatitis is a possibility.  Original Report Authenticated By: Darrol Angel, M.D.    Scheduled Meds:   . sodium chloride   Intravenous STAT  . amLODipine  5 mg Oral Daily  . chlordiazePOXIDE  5 mg Oral TID  . enoxaparin (LOVENOX) injection  40 mg Subcutaneous Q24H  . folic acid  1 mg Oral Daily  . iohexol  20 mL Oral Q1 Hr x 2  . metoprolol tartrate  50 mg Oral BID  . pantoprazole (PROTONIX) IV  40 mg Intravenous Q1200  . potassium chloride  10 mEq Intravenous Q1 Hr x 3  . thiamine  100 mg Oral Daily  . DISCONTD: potassium chloride  10 mEq Intravenous Q1 Hr x 4   Continuous Infusions:   . sodium chloride 150 mL/hr at 03/14/12 1430     Assessment/Plan: 1. Acute  Pancreatitis *Improving, CT shows no complications of pancreatitis, etiology ETOH *Discussed alcohol addiction, offered support *Continue IV fluids, not requiring pain meds *Advance diet today. *FLP shows hyperlipidemia  2. ETOH Abuse *resources Given *continue CIWA, possible W/D day 2-3  3. HTN *3 drug regimen, Norvasc, Metoprolol, prn clonidine * Will schedule clonidine-may help with ETOH w/d  4. Hyperlipidemia * LDL 200 will start a statin  5. Hypokalemia *will check a Mag level *daily repletion * IV and PO KCl today  Code Status: Full Code Family Communication: Care discussed with patient at bedside Disposition Plan: Home tomorrow if she tolerates her diet and K is normal   Anderson Malta, MD  [facility] Pager [phone]  If 7PM-7AM, please contact  night-coverage [email or web address] Password [email or web address] 03/15/2012, 12:47 PM   LOS: 1 day

## 2012-03-16 LAB — CBC
Platelets: 349 10*3/uL (ref 150–400)
RBC: 4.15 MIL/uL (ref 3.87–5.11)
WBC: 13.3 10*3/uL — ABNORMAL HIGH (ref 4.0–10.5)

## 2012-03-16 LAB — BASIC METABOLIC PANEL
CO2: 28 mEq/L (ref 19–32)
Chloride: 97 mEq/L (ref 96–112)
Sodium: 139 mEq/L (ref 135–145)

## 2012-03-16 LAB — PHOSPHORUS: Phosphorus: 3.8 mg/dL (ref 2.3–4.6)

## 2012-03-16 MED ORDER — FOLIC ACID 1 MG PO TABS: 1.0000 mg | ORAL_TABLET | Freq: Every day | ORAL | Status: AC

## 2012-03-16 MED ORDER — MAGNESIUM SULFATE 40 MG/ML IJ SOLN
2.0000 g | Freq: Once | INTRAMUSCULAR | Status: AC
  Administered 2012-03-16: 2 g via INTRAVENOUS
  Filled 2012-03-16: qty 50

## 2012-03-16 MED ORDER — THIAMINE HCL 100 MG PO TABS: 100.0000 mg | ORAL_TABLET | Freq: Every day | ORAL | Status: AC

## 2012-03-16 MED ORDER — SIMVASTATIN 20 MG PO TABS: 20.0000 mg | ORAL_TABLET | Freq: Every day | ORAL | Status: DC

## 2012-03-16 MED ORDER — METOPROLOL SUCCINATE ER 100 MG PO TB24: 100.0000 mg | ORAL_TABLET | Freq: Every day | ORAL | Status: DC

## 2012-03-16 MED ORDER — AMLODIPINE BESYLATE 5 MG PO TABS: 5.0000 mg | ORAL_TABLET | Freq: Every day | ORAL | Status: AC

## 2012-03-16 MED ORDER — POTASSIUM CHLORIDE CRYS ER 20 MEQ PO TBCR
40.0000 meq | EXTENDED_RELEASE_TABLET | Freq: Once | ORAL | Status: AC
  Administered 2012-03-16: 40 meq via ORAL
  Filled 2012-03-16: qty 2

## 2012-03-16 MED ORDER — HYDROCODONE-ACETAMINOPHEN 5-500 MG PO CAPS: 1.0000 | ORAL_CAPSULE | Freq: Four times a day (QID) | ORAL | Status: AC | PRN

## 2012-03-16 MED ORDER — CLONIDINE HCL 0.2 MG PO TABS
0.2000 mg | ORAL_TABLET | Freq: Four times a day (QID) | ORAL | Status: DC | PRN
  Filled 2012-03-16: qty 1

## 2012-03-16 MED ORDER — POTASSIUM CHLORIDE CRYS ER 20 MEQ PO TBCR: 40.0000 meq | EXTENDED_RELEASE_TABLET | Freq: Every day | ORAL | Status: DC

## 2012-03-16 MED ORDER — CALCIUM-MAGNESIUM 300-300 MG PO TABS: 1.0000 | ORAL_TABLET | Freq: Every day | ORAL | Status: DC

## 2012-03-16 MED ORDER — OMEPRAZOLE 20 MG PO CPDR: 20.0000 mg | DELAYED_RELEASE_CAPSULE | Freq: Every day | ORAL | Status: AC

## 2012-03-16 MED ORDER — CLONIDINE HCL 0.2 MG PO TABS: 0.2000 mg | ORAL_TABLET | Freq: Two times a day (BID) | ORAL | Status: DC

## 2012-03-16 MED ORDER — CHLORDIAZEPOXIDE HCL 5 MG PO CAPS: 5.0000 mg | ORAL_CAPSULE | Freq: Every day | ORAL | Status: AC

## 2012-03-16 NOTE — Progress Notes (Signed)
Per MD Anderson Malta, Ok for pt to have tablets of Lorcet-HD instead of capsules. Called back to pharmD Jasmine December at [phone] ([facility])

## 2012-03-16 NOTE — Progress Notes (Signed)
Lisa Blanchard to be D/C'd Home per MD order.  Discussed with the patient and all questions fully answered.   [name], [name]  Home Medication Instructions [id number]   Printed on:03/16/12 1452  Medication Information                    amLODipine (NORVASC) 5 MG tablet Take 1 tablet (5 mg total) by mouth daily.           cloNIDine (CATAPRES) 0.2 MG tablet Take 1 tablet (0.2 mg total) by mouth 2 (two) times daily.           folic acid (FOLVITE) 1 MG tablet Take 1 tablet (1 mg total) by mouth daily.           potassium chloride SA (K-DUR,KLOR-CON) 20 MEQ tablet Take 2 tablets (40 mEq total) by mouth daily.           simvastatin (ZOCOR) 20 MG tablet Take 1 tablet (20 mg total) by mouth daily at 6 PM.           thiamine 100 MG tablet Take 1 tablet (100 mg total) by mouth daily.           chlordiazePOXIDE (LIBRIUM) 5 MG capsule Take 1 capsule (5 mg total) by mouth at bedtime.           hydrocodone-acetaminophen (LORCET-HD) 5-500 MG per capsule Take 1 capsule by mouth every 6 (six) hours as needed for pain.           omeprazole (PRILOSEC) 20 MG capsule Take 1 capsule (20 mg total) by mouth daily.           metoprolol succinate (TOPROL XL) 100 MG 24 hr tablet Take 1 tablet (100 mg total) by mouth daily. Take with or immediately following a meal.           Calcium-Magnesium (CALCIUM MAGNESIUM 750) 300-300 MG TABS Take 1 tablet by mouth daily.             VVS, Skin clean, dry and intact without evidence of skin break down, no evidence of skin tears noted. IV catheter discontinued intact. Site without signs and symptoms of complications. Dressing and pressure applied.  An After Visit Summary was printed and given to the patient. Patient escorted via WC, and D/C home via private auto.  Kennyth Arnold D 03/16/2012 2:52 PM

## 2012-03-16 NOTE — Progress Notes (Addendum)
Clinical Social Worker received referral in [place] for other psychosocial needs. Clinical Social Worker reviewed chart and noted ED Clinical Social Worker screened pt and no social work needs identified at that time and no social work needs identified during admission. No standing order in EPIC. Pt discharge home. Clinical Social Worker signed off.  Jacklynn Lewis, MSW, LCSWA  Clinical Social Work [phone]

## 2012-04-05 NOTE — Discharge Summary (Addendum)
Physician Discharge Summary  Lisa Blanchard MWU:[id number] DOB: 1960/09/29 DOA: 03/14/2012  PCP: Sheila Oats, MD  Admit date: 03/14/2012 Discharge date: 03/16/2012  Recommendations for Outpatient Follow-up:  F/U with PCP  Discharge Diagnoses:  Principal Problem:  *Acute alcoholic pancreatitis Active Problems:  Alcohol abuse  HTN (hypertension)  Hypokalemia  Hyperlipidemia  Smoking hx  Abdominal pain, epigastric  Discharge Condition:  Stable improved.  Diet recommendation: Heart Healthy  Wt Readings from Last 3 Encounters:  03/16/12 54.2 kg (119 lb 7.8 oz)    History of present illness:  Lisa Blanchard is a 50 y.o. female, with history of hypertension, ongoing alcohol and smoking who has had one episode of alcoholic pancreatitis a few years ago at a [facility] in [place]. Patient presented to the ER with a one-week history of epigastric abdominal pain which is sharp in nature and radiating to her back, worse with food better with bowel rest and pain medications, not associated with any other symptoms, pain was intermittent, came to the ER where she was diagnosed with acute pancreatitis.  Hospital Course:  Admitted for symptomatic management of acute pancreatitis. Aggressive IV hydration, NPO, pain control Tolerating PO at discharge Was placed on CIWA, no withdrawal Thiamine and folate given.  Acute Pancreatitis *Improved, CT shows no complications of pancreatitis, etiology ETOH  *Discussed alcohol addiction, offered support  *Continue IV fluids, not requiring pain meds  *Advanced diet  *FLP shows hyperlipidemia   2. ETOH Abuse  *resources Given   3. HTN  *3 drug regimen, Norvasc, Metoprolol, prn clonidine  * Will schedule clonidine-may help with ETOH w/d   4. Hyperlipidemia  * LDL 200 will start a statin   5. Hypokalemia  *will check a Mag level  *daily repletion  * IV and PO KCl today   Discharge Exam: Filed Vitals:   03/16/12 1433  BP: 114/69    Pulse: 78  Temp: 97.9 F (36.6 C)  Resp: 18   Filed Vitals:   03/15/12 1400 03/15/12 2127 03/16/12 0518 03/16/12 1433  BP: 169/96 134/82 142/86 114/69  Pulse: 84 73 72 78  Temp: 98.5 F (36.9 C) 98.3 F (36.8 C) 97.8 F (36.6 C) 97.9 F (36.6 C)  TempSrc: Oral Oral Oral Oral  Resp: 18 16 17 18   Height:      Weight:   54.2 kg (119 lb 7.8 oz)   SpO2: 98% 97% 97% 98%   General appearance: NAD, conversant  Eyes: anicteric sclerae, moist conjunctivae; no lid-lag; PERRLA HENT: Atraumatic; oropharynx clear with moist mucous membranes and no mucosal ulcerations; normal hard and soft palate Neck: Trachea midline; FROM, supple, no thyromegaly or lymphadenopathy Lungs: CTA, with normal respiratory effort and no intercostal retractions CV: RRR, no MRGs  Abdomen: Soft, non-tender; no masses or HSM Extremities: No peripheral edema or extremity lymphadenopathy Skin: Normal temperature, turgor and texture; no rash, ulcers or subcutaneous nodules Psych: Appropriate affect, alert and oriented to person, place and time    Discharge Instructions  Discharge Orders    Future Orders Please Complete By Expires   Diet - low sodium heart healthy      Increase activity slowly      Discharge instructions      Comments:   1. Follow-up with PCP in 1 week for potassium check 2. Your vitamins/electrolyes potassium, magnesium, calcium were low so you have been put on several replacement medications. As your nutritional status improved these can be discontinued after seeing your PCP. 3. Please join AA and stop drinking so  you can live a long healthy life.     Medication List  As of 04/05/2012  5:34 PM   TAKE these medications         amLODipine 5 MG tablet   Commonly known as: NORVASC   Take 1 tablet (5 mg total) by mouth daily.      Calcium-Magnesium 300-300 MG Tabs   Take 1 tablet by mouth daily.      chlordiazePOXIDE 5 MG capsule   Commonly known as: LIBRIUM   Take 1 capsule (5 mg total)  by mouth at bedtime.      cloNIDine 0.2 MG tablet   Commonly known as: CATAPRES   Take 1 tablet (0.2 mg total) by mouth 2 (two) times daily.      folic acid 1 MG tablet   Commonly known as: FOLVITE   Take 1 tablet (1 mg total) by mouth daily.      metoprolol succinate 100 MG 24 hr tablet   Commonly known as: TOPROL-XL   Take 1 tablet (100 mg total) by mouth daily. Take with or immediately following a meal.      omeprazole 20 MG capsule   Commonly known as: PRILOSEC   Take 1 capsule (20 mg total) by mouth daily.      potassium chloride SA 20 MEQ tablet   Commonly known as: K-DUR,KLOR-CON   Take 2 tablets (40 mEq total) by mouth daily.      simvastatin 20 MG tablet   Commonly known as: ZOCOR   Take 1 tablet (20 mg total) by mouth daily at 6 PM.      thiamine 100 MG tablet   Take 1 tablet (100 mg total) by mouth daily.           Follow-up Information    Schedule an appointment as soon as possible for a visit with [facility]. (Need blood work to check potassium level)    Contact information:   [address] [place] [address]           The results of significant diagnostics from this hospitalization (including imaging, microbiology, ancillary and laboratory) are listed below for reference.    Significant Diagnostic Studies: Ct Abdomen Pelvis W Wo Contrast  03/14/2012  *RADIOLOGY REPORT*  Clinical Data: Acute pancreatitis., elevated lipase and white blood cell count  CT ABDOMEN AND PELVIS WITHOUT AND WITH CONTRAST  Technique:  Multidetector CT imaging of the abdomen and pelvis was performed without contrast material in one or both body regions, followed by contrast material(s) and further sections in one or both body regions.  Contrast: OMNIPAQUE IOHEXOL 300 MG/ML  SOLN  Comparison: Abdominal ultrasound 03/14/2012  Findings: Mild linear atelectasis at the posterior lung bases.  No pleural fluid.  No pericardial fluid.  There is  no focal hepatic lesion.  No intrahepatic or extrahepatic biliary ductal dilatation.  There are two small gallstones within the gallbladder measuring less than 5 mm.  No evidence of gallbladder inflammation.  The pancreas is edematous and there is fluid along the pancreas head, body and tail.  This fluid extends along the left and right anterior pararenal fascia and into the pelvis.  These findings are consistent acute pancreatitis.  Pancreatic parenchyma enhances uniformly.  There is no organized fluid collection.  No evidence of vascular complication involving the celiac or SMA arteries.  The common bile duct is not identified.  No evidence of common bile duct stone.  The spleen is small.  The adrenal glands and kidneys are normal. The stomach, duodenum, small bowel, and cecum normal.  There are surgical clips in the inferior margin of the cecum which may indicate prior appendectomy.  The colon is predominately collapsed.  Abdominal aorta is normal caliber.  No retroperitoneal or periportal lymphadenopathy.  There is moderate amount of free fluid the pelvis which has simple attenuation.  The ovaries and uterus are normal.  Bladder is normal.  No pelvic lymphadenopathy. Review of  bone windows demonstrates no aggressive osseous lesions.  IMPRESSION:  1.  Acute pancreatitis.  No organized fluid collection or vascular complication.  No evidence of necrosis. 2.  No evidence of common bile duct stone or ductal dilatation to suggest gallstone pancreatitis although there are several small gallstones in the gallbladder.  Original Report Authenticated By: Genevive Bi, M.D.   US Abdomen Complete  03/14/2012  *RADIOLOGY REPORT*  Clinical Data:  Epigastric and right upper quadrant abdominal pain. Elevated lipase.  COMPLETE ABDOMINAL ULTRASOUND  Comparison:  None.  Findings:  Gallbladder:  6 mm mobile gallstone in the gallbladder.  No gallbladder wall thickening or pericholecystic fluid.  The patient was not tender over  the gallbladder.  Common bile duct:  Mildly dilated, measuring 6.0 mm in diameter proximally.  Liver:  Mildly echogenic and inhomogeneous.  IVC:  Appears normal.  Pancreas:  Mildly dilated pancreatic duct, measuring 3.1 mm in diameter.  The pancreatic head and tail are suboptimally visualized.  Spleen:  Poorly visualized, measuring 2.4 cm in length.  Right Kidney:  Normal, measuring 11.5 cm in length.  Left Kidney:  Normal, measuring 11.7 cm in length.  Abdominal aorta:  Obscured by overlying bowel gas distally.  The visualized portions are normal in caliber.  IMPRESSION:  1.  Cholelithiasis without evidence of cholecystitis. 2.  Mildly dilated common duct and pancreatic duct.  ERCP may provide useful additional information regarding the cause of the ductal dilatation. 3.  Mildly echogenic and heterogeneous liver, possibly due to steatosis. 4.  Poorly visualized pancreatic head and tail.  Pancreatitis is a possibility.  Original Report Authenticated By: Darrol Angel, M.D.    Microbiology: No results found for this or any previous visit (from the past 240 hour(s)).   Labs: Basic Metabolic Panel:   Lab  03/15/12 0658  03/14/12 1825  03/14/12 1419  03/14/12 0851   NA  138  --  --  138   K  3.2*  3.3*  --  2.5*   CL  97  --  --  90*   CO2  27  --  --  31   GLUCOSE  133*  --  --  226*   BUN  3*  --  --  3*   CREATININE  0.40*  --  0.41*  0.44*   CALCIUM  8.9  --  --  10.2   MG  --  1.1*  --  --   PHOS  --  --  --  --    Liver Function Tests:   Lab  03/15/12 0658  03/14/12 0851   AST  13  17   ALT  6  8   ALKPHOS  64  77   BILITOT  0.3  0.3   PROT  7.0  7.9   ALBUMIN  3.3*  4.0     Lab  03/15/12 0658  03/14/12 0851   LIPASE  1127*  2098*   AMYLASE  --  --    No results found  for this basename: AMMONIA:5 in the last 168 hours  CBC:   Lab  03/15/12 0658  03/14/12 1419  03/14/12 0851   WBC  15.6*  17.5*  16.8*   NEUTROABS  --  --  14.0*   HGB  13.8  15.0  15.6*   HCT  40.4  44.1   44.3   MCV  91.8  92.6  91.7   PLT  340  343  359         Time coordinating discharge: 35 minutes  Signed:  GOLDING,ELIZABETH  [facility] 04/05/2012, 5:34 PM

## 2016-07-31 ENCOUNTER — Other Ambulatory Visit: Payer: Self-pay

## 2016-07-31 ENCOUNTER — Encounter (HOSPITAL_COMMUNITY): Payer: Self-pay | Admitting: Emergency Medicine

## 2016-07-31 ENCOUNTER — Emergency Department (HOSPITAL_COMMUNITY)
Admission: EM | Admit: 2016-07-31 | Discharge: 2016-07-31 | Disposition: A | Discharge status: Home / Self Care | Attending: Physician Assistant | Admitting: Physician Assistant

## 2016-07-31 ENCOUNTER — Telehealth: Payer: Self-pay | Admitting: *Deleted

## 2016-07-31 DIAGNOSIS — Z79899 Other long term (current) drug therapy: Secondary | ICD-10-CM | POA: Diagnosis not present

## 2016-07-31 DIAGNOSIS — I10 Essential (primary) hypertension: Secondary | ICD-10-CM | POA: Diagnosis not present

## 2016-07-31 DIAGNOSIS — N644 Mastodynia: Secondary | ICD-10-CM | POA: Diagnosis not present

## 2016-07-31 DIAGNOSIS — F17200 Nicotine dependence, unspecified, uncomplicated: Secondary | ICD-10-CM | POA: Diagnosis not present

## 2016-07-31 NOTE — ED Triage Notes (Signed)
Pt states she has been having shooting pains in bilateral breasts for a while. No other symptoms associated with it

## 2016-07-31 NOTE — ED Notes (Signed)
EDP aware of increased BP. Pt encouraged to take medicines and follow up with her PCP.

## 2016-07-31 NOTE — Telephone Encounter (Signed)
Pt and significant other walled in ER stating her PCP will not see her as she has not been in their office in 10 years.  EDCM consulted to assist with getting pt PCP.  EDCM called [facility] to see if they were accepting new patients and if they accepted pt insurance.  Receptionist confirmed acceptance of new pts and pt insurance.  EDCM gave pt printed out [facility] along with telephone number and address.  Pt states she will visit the office today to set up an appointment.

## 2016-07-31 NOTE — ED Provider Notes (Addendum)
[facility]-EMERGENCY DEPT Provider Note   CSN: [id number] Arrival date & time: 07/31/16  [id number]     History   Chief Complaint Chief Complaint  Patient presents with  . Breast Pain    HPI Lisa Blanchard is a 55 y.o. female.  HPI  She is a [age] her old the nail presenting with bilateral breast pain for last 2 months. Patient reports that she occasionally has aching occasionally shooting pain in her breast. She has not noticed any lumps. She cannot get with her primary until January. She's not had a mammography for number of years.  She has gone through menopause. She's noticed no color changes in her breast. No cough no shortness breath and fevers. \ Past Medical History:  Diagnosis Date  . Alcohol abuse 03/14/2012  . HTN (hypertension) 03/14/2012  . Hypertension   . Smoking hx 03/14/2012    Patient Active Problem List   Diagnosis Date Noted  . Hyperlipidemia 03/15/2012  . Acute alcoholic pancreatitis 03/14/2012  . Alcohol abuse 03/14/2012  . Smoking hx 03/14/2012  . HTN (hypertension) 03/14/2012  . Hypokalemia 03/14/2012  . Abdominal pain, epigastric 03/14/2012    Past Surgical History:  Procedure Laterality Date  . APPENDECTOMY      OB History    No data available       Home Medications    Prior to Admission medications   Medication Sig Start Date End Date Taking? Authorizing Provider  amLODipine (NORVASC) 5 MG tablet Take 1 tablet (5 mg total) by mouth daily. 03/16/12 03/16/13  Edsel PetrinElizabeth L Golding, DO  Calcium-Magnesium (CALCIUM MAGNESIUM 750) 300-300 MG TABS Take 1 tablet by mouth daily. Patient not taking: Reported on 07/31/2016 03/16/12   Edsel PetrinElizabeth L Golding, DO  cloNIDine (CATAPRES) 0.2 MG tablet Take 1 tablet (0.2 mg total) by mouth 2 (two) times daily. Patient not taking: Reported on 07/31/2016 03/16/12 03/16/13  Edsel PetrinElizabeth L Golding, DO  metoprolol succinate (TOPROL XL) 100 MG 24 hr tablet Take 1 tablet (100 mg total) by mouth daily. Take with or immediately following a  meal. Patient not taking: Reported on 07/31/2016 03/16/12 03/16/13  Edsel PetrinElizabeth L Golding, DO  omeprazole (PRILOSEC) 20 MG capsule Take 1 capsule (20 mg total) by mouth daily. Patient not taking: Reported on 07/31/2016 03/16/12 03/16/13  Edsel PetrinElizabeth L Golding, DO  potassium chloride SA (K-DUR,KLOR-CON) 20 MEQ tablet Take 2 tablets (40 mEq total) by mouth daily. Patient not taking: Reported on 07/31/2016 03/16/12 03/16/13  Edsel PetrinElizabeth L Golding, DO  simvastatin (ZOCOR) 20 MG tablet Take 1 tablet (20 mg total) by mouth daily at 6 PM. Patient not taking: Reported on 07/31/2016 03/16/12 03/16/13  Edsel PetrinElizabeth L Golding, DO    Family History History reviewed. No pertinent family history.  Social History Social History  Substance Use Topics  . Smoking status: Current Every Day Smoker  . Smokeless tobacco: Never Used  . Alcohol use Yes     Allergies   Patient has no known allergies.   Review of Systems Review of Systems  Constitutional: Negative for fatigue and fever.  Respiratory: Negative for cough, chest tightness and shortness of breath.   Cardiovascular: Negative for chest pain.  Genitourinary: Negative for dysuria.  All other systems reviewed and are negative.    Physical Exam Updated Vital Signs There were no vitals taken for this visit.  Physical Exam  Constitutional: She is oriented to person, place, and time. She appears well-developed and well-nourished.  HENT:  Head: Normocephalic and atraumatic.  Eyes: Right eye exhibits no  discharge.  Cardiovascular: Normal rate, regular rhythm and normal heart sounds.   No murmur heard. Pulmonary/Chest: Effort normal and breath sounds normal. She has no wheezes. She has no rales.  Bilateral breast- no cystis, no lumps noted, no skin change no peau d'orange.  Abdominal: Soft. She exhibits no distension. There is no tenderness.  Neurological: She is oriented to person, place, and time.  Skin: Skin is warm and dry. She is not diaphoretic.    Psychiatric: She has a normal mood and affect.  Nursing note and vitals reviewed.    ED Treatments / Results  Labs (all labs ordered are listed, but only abnormal results are displayed) Labs Reviewed - No data to display  EKG  EKG Interpretation None       Radiology No results found.  Procedures Procedures (including critical care time)  Medications Ordered in ED Medications - No data to display   Initial Impression / Assessment and Plan / ED Course  I have reviewed the triage vital signs and the nursing notes.  Pertinent labs & imaging results that were available during my care of the patient were reviewed by me and considered in my medical decision making (see chart for details).  Clinical Course     She is a well-appearing 55 year old female with breast pain. Patient has bilateral breast pain for the last 2 months. No lumps noted no skin changes noted no shortness of breath or other concerns. Patient is unable to see her primary doctor January. We will refer her to an imaging center for mammography. Additionally we'll have her follow-up with her primary care physician. I do not see any emergency pathology at this time given her normal physical exam and vital signs.  10:41 AM Patient left and try to get her primary care physician's office. They reported that they could not see her because she's not been there in a number of years. Patient came back to the emergency department asking for a perscription for mammography. I don't feel that is within my scope of practice and therefore asked social work to have her meet her in the lobby and get her primary care physician's office as soon as possible.  Final Clinical Impressions(s) / ED Diagnoses   Final diagnoses:  None    New Prescriptions New Prescriptions   No medications on file     Marna Weniger Randall Lisa Fayetta Sorenson, MD 07/31/16 0916    Savita Runner Randall Lisa Luismario Coston, MD 07/31/16 1042

## 2016-07-31 NOTE — Discharge Instructions (Signed)
Your came today with bilateral breast tenderness. We cannot feeling lumps but we are concerned about yourpain. We will need to follow-up and get a mammography. We think you should follow up with  your primary care physician they will be able to schedule. We also gave you the name and number of a place in the area that does breast imaging.

## 2016-08-05 ENCOUNTER — Other Ambulatory Visit: Payer: Self-pay | Admitting: Family

## 2016-08-05 DIAGNOSIS — Z1231 Encounter for screening mammogram for malignant neoplasm of breast: Secondary | ICD-10-CM

## 2016-08-05 DIAGNOSIS — N644 Mastodynia: Secondary | ICD-10-CM

## 2016-08-08 ENCOUNTER — Other Ambulatory Visit: Payer: Self-pay | Admitting: Family

## 2016-08-08 ENCOUNTER — Ambulatory Visit
Admission: RE | Admit: 2016-08-08 | Discharge: 2016-08-08 | Disposition: A | Discharge status: Home / Self Care | Source: Ambulatory Visit | Attending: Family | Admitting: Family

## 2016-08-08 DIAGNOSIS — R0789 Other chest pain: Secondary | ICD-10-CM

## 2016-08-13 ENCOUNTER — Ambulatory Visit
Admission: RE | Admit: 2016-08-13 | Discharge: 2016-08-13 | Disposition: A | Discharge status: Home / Self Care | Source: Ambulatory Visit | Attending: Family | Admitting: Family

## 2016-08-13 ENCOUNTER — Other Ambulatory Visit: Payer: Self-pay | Admitting: Family

## 2016-08-13 ENCOUNTER — Ambulatory Visit

## 2016-08-13 DIAGNOSIS — R921 Mammographic calcification found on diagnostic imaging of breast: Secondary | ICD-10-CM

## 2016-08-13 DIAGNOSIS — N644 Mastodynia: Secondary | ICD-10-CM

## 2016-08-19 ENCOUNTER — Ambulatory Visit
Admission: RE | Admit: 2016-08-19 | Discharge: 2016-08-19 | Disposition: A | Discharge status: Home / Self Care | Source: Ambulatory Visit | Attending: Family | Admitting: Family

## 2016-08-19 DIAGNOSIS — R921 Mammographic calcification found on diagnostic imaging of breast: Secondary | ICD-10-CM

## 2020-07-19 ENCOUNTER — Inpatient Hospital Stay (HOSPITAL_COMMUNITY): Payer: No Typology Code available for payment source

## 2020-07-19 ENCOUNTER — Emergency Department (HOSPITAL_COMMUNITY): Payer: No Typology Code available for payment source

## 2020-07-19 ENCOUNTER — Encounter (HOSPITAL_COMMUNITY): Payer: Self-pay | Admitting: Internal Medicine

## 2020-07-19 ENCOUNTER — Other Ambulatory Visit: Payer: Self-pay

## 2020-07-19 ENCOUNTER — Inpatient Hospital Stay (HOSPITAL_COMMUNITY)
Admission: EM | Admit: 2020-07-19 | Discharge: 2020-07-22 | DRG: 871 | Disposition: A | Discharge status: Home / Self Care | Payer: No Typology Code available for payment source | Attending: Internal Medicine | Admitting: Internal Medicine

## 2020-07-19 DIAGNOSIS — F17200 Nicotine dependence, unspecified, uncomplicated: Secondary | ICD-10-CM | POA: Diagnosis not present

## 2020-07-19 DIAGNOSIS — E861 Hypovolemia: Secondary | ICD-10-CM | POA: Diagnosis present

## 2020-07-19 DIAGNOSIS — E876 Hypokalemia: Secondary | ICD-10-CM | POA: Diagnosis present

## 2020-07-19 DIAGNOSIS — Z66 Do not resuscitate: Secondary | ICD-10-CM | POA: Diagnosis present

## 2020-07-19 DIAGNOSIS — Z8719 Personal history of other diseases of the digestive system: Secondary | ICD-10-CM | POA: Diagnosis not present

## 2020-07-19 DIAGNOSIS — K852 Alcohol induced acute pancreatitis without necrosis or infection: Secondary | ICD-10-CM | POA: Diagnosis present

## 2020-07-19 DIAGNOSIS — E785 Hyperlipidemia, unspecified: Secondary | ICD-10-CM | POA: Diagnosis present

## 2020-07-19 DIAGNOSIS — Z7984 Long term (current) use of oral hypoglycemic drugs: Secondary | ICD-10-CM

## 2020-07-19 DIAGNOSIS — Z7982 Long term (current) use of aspirin: Secondary | ICD-10-CM | POA: Diagnosis not present

## 2020-07-19 DIAGNOSIS — E1165 Type 2 diabetes mellitus with hyperglycemia: Secondary | ICD-10-CM | POA: Diagnosis present

## 2020-07-19 DIAGNOSIS — E875 Hyperkalemia: Secondary | ICD-10-CM | POA: Diagnosis present

## 2020-07-19 DIAGNOSIS — Y900 Blood alcohol level of less than 20 mg/100 ml: Secondary | ICD-10-CM | POA: Diagnosis present

## 2020-07-19 DIAGNOSIS — E860 Dehydration: Secondary | ICD-10-CM | POA: Diagnosis present

## 2020-07-19 DIAGNOSIS — I10 Essential (primary) hypertension: Secondary | ICD-10-CM | POA: Diagnosis present

## 2020-07-19 DIAGNOSIS — E782 Mixed hyperlipidemia: Secondary | ICD-10-CM | POA: Diagnosis not present

## 2020-07-19 DIAGNOSIS — A419 Sepsis, unspecified organism: Principal | ICD-10-CM | POA: Diagnosis present

## 2020-07-19 DIAGNOSIS — F17210 Nicotine dependence, cigarettes, uncomplicated: Secondary | ICD-10-CM | POA: Diagnosis present

## 2020-07-19 DIAGNOSIS — F10230 Alcohol dependence with withdrawal, uncomplicated: Secondary | ICD-10-CM | POA: Diagnosis present

## 2020-07-19 DIAGNOSIS — Z79899 Other long term (current) drug therapy: Secondary | ICD-10-CM

## 2020-07-19 DIAGNOSIS — K8020 Calculus of gallbladder without cholecystitis without obstruction: Secondary | ICD-10-CM | POA: Diagnosis present

## 2020-07-19 DIAGNOSIS — Z20822 Contact with and (suspected) exposure to covid-19: Secondary | ICD-10-CM | POA: Diagnosis present

## 2020-07-19 DIAGNOSIS — N179 Acute kidney failure, unspecified: Secondary | ICD-10-CM | POA: Diagnosis present

## 2020-07-19 DIAGNOSIS — E871 Hypo-osmolality and hyponatremia: Secondary | ICD-10-CM | POA: Diagnosis present

## 2020-07-19 LAB — CBC
HCT: 38 % (ref 36.0–46.0)
Hemoglobin: 12 g/dL (ref 12.0–15.0)
MCH: 30.6 pg (ref 26.0–34.0)
MCHC: 31.6 g/dL (ref 30.0–36.0)
MCV: 96.9 fL (ref 80.0–100.0)
Platelets: 375 10*3/uL (ref 150–400)
RBC: 3.92 MIL/uL (ref 3.87–5.11)
RDW: 13.7 % (ref 11.5–15.5)
WBC: 12 10*3/uL — ABNORMAL HIGH (ref 4.0–10.5)
nRBC: 0 % (ref 0.0–0.2)

## 2020-07-19 LAB — CBG MONITORING, ED: Glucose-Capillary: 131 mg/dL — ABNORMAL HIGH (ref 70–99)

## 2020-07-19 LAB — RESPIRATORY PANEL BY RT PCR (FLU A&B, COVID)
Influenza A by PCR: NEGATIVE
Influenza B by PCR: NEGATIVE
SARS Coronavirus 2 by RT PCR: NEGATIVE

## 2020-07-19 LAB — COMPREHENSIVE METABOLIC PANEL
ALT: 9 U/L (ref 0–44)
AST: 14 U/L — ABNORMAL LOW (ref 15–41)
Albumin: 3.5 g/dL (ref 3.5–5.0)
Alkaline Phosphatase: 41 U/L (ref 38–126)
Anion gap: 16 — ABNORMAL HIGH (ref 5–15)
BUN: 52 mg/dL — ABNORMAL HIGH (ref 6–20)
CO2: 12 mmol/L — ABNORMAL LOW (ref 22–32)
Calcium: 9.2 mg/dL (ref 8.9–10.3)
Chloride: 104 mmol/L (ref 98–111)
Creatinine, Ser: 6.34 mg/dL — ABNORMAL HIGH (ref 0.44–1.00)
GFR, Estimated: 7 mL/min — ABNORMAL LOW (ref >60)
Glucose, Bld: 173 mg/dL — ABNORMAL HIGH (ref 70–99)
Potassium: 5.5 mmol/L — ABNORMAL HIGH (ref 3.5–5.1)
Sodium: 132 mmol/L — ABNORMAL LOW (ref 135–145)
Total Bilirubin: 1.2 mg/dL (ref 0.3–1.2)
Total Protein: 6.4 g/dL — ABNORMAL LOW (ref 6.5–8.1)

## 2020-07-19 LAB — TSH: TSH: 0.099 u[IU]/mL — ABNORMAL LOW (ref 0.350–4.500)

## 2020-07-19 LAB — LIPASE, BLOOD: Lipase: 921 U/L — ABNORMAL HIGH (ref 11–51)

## 2020-07-19 LAB — HIV ANTIBODY (ROUTINE TESTING W REFLEX): HIV Screen 4th Generation wRfx: NONREACTIVE

## 2020-07-19 LAB — HEMOGLOBIN A1C
Hgb A1c MFr Bld: 5.3 % (ref 4.8–5.6)
Mean Plasma Glucose: 105.41 mg/dL

## 2020-07-19 LAB — ETHANOL: Alcohol, Ethyl (B): <10 mg/dL (ref <10)

## 2020-07-19 MED ORDER — SODIUM CHLORIDE 0.9 % IV SOLN: INTRAVENOUS | Status: DC

## 2020-07-19 MED ORDER — SODIUM CHLORIDE 0.9 % IV BOLUS
1000.0000 mL | Freq: Once | INTRAVENOUS | Status: AC
  Administered 2020-07-19: 1000 mL via INTRAVENOUS

## 2020-07-19 MED ORDER — LACTATED RINGERS IV SOLN: INTRAVENOUS | Status: DC

## 2020-07-19 MED ORDER — POLYVINYL ALCOHOL 1.4 % OP SOLN
1.0000 [drp] | Freq: Four times a day (QID) | OPHTHALMIC | Status: DC | PRN
  Filled 2020-07-19: qty 15

## 2020-07-19 MED ORDER — DOCUSATE SODIUM 283 MG RE ENEM
1.0000 | ENEMA | RECTAL | Status: DC | PRN
  Filled 2020-07-19: qty 1

## 2020-07-19 MED ORDER — THIAMINE HCL 100 MG/ML IJ SOLN
100.0000 mg | Freq: Every day | INTRAMUSCULAR | Status: DC
  Filled 2020-07-19 (×2): qty 2

## 2020-07-19 MED ORDER — ZOLPIDEM TARTRATE 5 MG PO TABS: 5.0000 mg | ORAL_TABLET | Freq: Every evening | ORAL | Status: DC | PRN

## 2020-07-19 MED ORDER — LORAZEPAM 1 MG PO TABS: 1.0000 mg | ORAL_TABLET | ORAL | Status: DC | PRN

## 2020-07-19 MED ORDER — SODIUM CHLORIDE 0.9% FLUSH
3.0000 mL | Freq: Two times a day (BID) | INTRAVENOUS | Status: DC
  Administered 2020-07-19 – 2020-07-21 (×4): 3 mL via INTRAVENOUS

## 2020-07-19 MED ORDER — ASPIRIN EC 81 MG PO TBEC: 81.0000 mg | DELAYED_RELEASE_TABLET | Freq: Every day | ORAL | Status: DC

## 2020-07-19 MED ORDER — POLYETHYL GLYCOL-PROPYL GLYCOL 0.4-0.3 % OP SOLN: 1.0000 [drp] | Freq: Four times a day (QID) | OPHTHALMIC | Status: DC | PRN

## 2020-07-19 MED ORDER — INSULIN ASPART 100 UNIT/ML ~~LOC~~ SOLN
0.0000 [IU] | Freq: Three times a day (TID) | SUBCUTANEOUS | Status: DC
  Administered 2020-07-21 – 2020-07-22 (×2): 1 [IU] via SUBCUTANEOUS

## 2020-07-19 MED ORDER — THIAMINE HCL 100 MG PO TABS
100.0000 mg | ORAL_TABLET | Freq: Every day | ORAL | Status: DC
  Administered 2020-07-19 – 2020-07-22 (×4): 100 mg via ORAL
  Filled 2020-07-19 (×4): qty 1

## 2020-07-19 MED ORDER — MORPHINE SULFATE (PF) 2 MG/ML IV SOLN: 2.0000 mg | INTRAVENOUS | Status: DC | PRN

## 2020-07-19 MED ORDER — HYDROXYZINE HCL 25 MG PO TABS: 25.0000 mg | ORAL_TABLET | Freq: Three times a day (TID) | ORAL | Status: DC | PRN

## 2020-07-19 MED ORDER — SORBITOL 70 % SOLN
30.0000 mL | Status: DC | PRN
  Filled 2020-07-19: qty 30

## 2020-07-19 MED ORDER — ONDANSETRON HCL 4 MG/2ML IJ SOLN: 4.0000 mg | Freq: Four times a day (QID) | INTRAMUSCULAR | Status: DC | PRN

## 2020-07-19 MED ORDER — DEXTROSE 5 % IV SOLN
INTRAVENOUS | Status: DC
Start: 2020-07-19 — End: 2020-07-19

## 2020-07-19 MED ORDER — ACETAMINOPHEN 325 MG PO TABS: 650.0000 mg | ORAL_TABLET | Freq: Four times a day (QID) | ORAL | Status: DC | PRN

## 2020-07-19 MED ORDER — ACETAMINOPHEN 650 MG RE SUPP: 650.0000 mg | Freq: Four times a day (QID) | RECTAL | Status: DC | PRN

## 2020-07-19 MED ORDER — SIMVASTATIN 20 MG PO TABS: 40.0000 mg | ORAL_TABLET | Freq: Every day | ORAL | Status: DC

## 2020-07-19 MED ORDER — CALCIUM CARBONATE ANTACID 1250 MG/5ML PO SUSP
500.0000 mg | Freq: Four times a day (QID) | ORAL | Status: DC | PRN
  Filled 2020-07-19: qty 5

## 2020-07-19 MED ORDER — HEPARIN SODIUM (PORCINE) 5000 UNIT/ML IJ SOLN
5000.0000 [IU] | Freq: Three times a day (TID) | INTRAMUSCULAR | Status: DC
  Administered 2020-07-19 – 2020-07-22 (×9): 5000 [IU] via SUBCUTANEOUS
  Filled 2020-07-19 (×9): qty 1

## 2020-07-19 MED ORDER — LORAZEPAM 2 MG/ML IJ SOLN: 0.0000 mg | Freq: Two times a day (BID) | INTRAMUSCULAR | Status: DC

## 2020-07-19 MED ORDER — CAMPHOR-MENTHOL 0.5-0.5 % EX LOTN
1.0000 "application " | TOPICAL_LOTION | Freq: Three times a day (TID) | CUTANEOUS | Status: DC | PRN
  Filled 2020-07-19: qty 222

## 2020-07-19 MED ORDER — HYDRALAZINE HCL 20 MG/ML IJ SOLN: 5.0000 mg | INTRAMUSCULAR | Status: DC | PRN

## 2020-07-19 MED ORDER — STERILE WATER FOR INJECTION IV SOLN
INTRAVENOUS | Status: DC
  Filled 2020-07-19 (×5): qty 850

## 2020-07-19 MED ORDER — LORAZEPAM 2 MG/ML IJ SOLN: 0.0000 mg | Freq: Four times a day (QID) | INTRAMUSCULAR | Status: AC

## 2020-07-19 MED ORDER — ONDANSETRON HCL 4 MG PO TABS: 4.0000 mg | ORAL_TABLET | Freq: Four times a day (QID) | ORAL | Status: DC | PRN

## 2020-07-19 MED ORDER — FOLIC ACID 1 MG PO TABS
1.0000 mg | ORAL_TABLET | Freq: Every day | ORAL | Status: DC
  Administered 2020-07-19 – 2020-07-22 (×4): 1 mg via ORAL
  Filled 2020-07-19 (×4): qty 1

## 2020-07-19 MED ORDER — NEPRO/CARBSTEADY PO LIQD
237.0000 mL | Freq: Three times a day (TID) | ORAL | Status: DC | PRN
  Filled 2020-07-19: qty 237

## 2020-07-19 MED ORDER — ADULT MULTIVITAMIN W/MINERALS CH
1.0000 | ORAL_TABLET | Freq: Every day | ORAL | Status: DC
  Administered 2020-07-19 – 2020-07-22 (×4): 1 via ORAL
  Filled 2020-07-19 (×4): qty 1

## 2020-07-19 MED ORDER — LORAZEPAM 2 MG/ML IJ SOLN: 1.0000 mg | INTRAMUSCULAR | Status: DC | PRN

## 2020-07-19 NOTE — ED Triage Notes (Signed)
Pt here with generalized weakness, nausea, and aching throughout her whole torso since yesterday. Denies fevers or sick contacts. Hypotensive in triage but reports not having taken her blood pressure medication in two days.

## 2020-07-19 NOTE — ED Notes (Signed)
Bladder scanner with 48ml urine

## 2020-07-19 NOTE — ED Provider Notes (Addendum)
[facility] EMERGENCY DEPARTMENT Provider Note   CSN: [id number] Arrival date & time: 07/19/20  1145     History Chief Complaint  Patient presents with  . Weakness  . Abdominal Pain    Lisa Blanchard is a 59 y.o. female.  HPI 59 year old female with a history of alcohol abuse, hypertension smoking presents to the ER with complaints of weakness, nausea and severe abdominal aching throughout her entire abdomen since Sunday.  Patient states that she drinks 2 glasses of gin every night while watching TV.  She stated that on Saturday she started to feel unwell and did not even want to drink her alcohol.  She states that the pain has progressively gotten worse.  She denies any vomiting, denies any dysuria or hematuria.  Denies any fevers or chills.  She is vaccinated for Covid.  She has not noticed any excessive swelling to her abdomen.  Arrived hypotensive to the ED with a blood pressure of 85/55.  Has not taken her medicines last few days.  Denies any other illicit drug use.    Past Medical History:  Diagnosis Date  . Alcohol abuse 03/14/2012  . HTN (hypertension) 03/14/2012  . Smoking hx 03/14/2012    Patient Active Problem List   Diagnosis Date Noted  . Hyperlipidemia 03/15/2012  . Acute alcoholic pancreatitis 03/14/2012  . Alcohol abuse 03/14/2012  . Smoking hx 03/14/2012  . HTN (hypertension) 03/14/2012  . Hypokalemia 03/14/2012  . Abdominal pain, epigastric 03/14/2012    Past Surgical History:  Procedure Laterality Date  . APPENDECTOMY       OB History   No obstetric history on file.     No family history on file.  Social History   Tobacco Use  . Smoking status: Current Every Day Smoker  . Smokeless tobacco: Never Used  Substance Use Topics  . Alcohol use: Yes  . Drug use: No    Home Medications Prior to Admission medications   Medication Sig Start Date End Date Taking? Authorizing Provider  aspirin EC 81 MG tablet Take 81 mg by mouth  daily. Swallow whole.   Yes [provider]  Cholecalciferol (VITAMIN D) 50 MCG (2000 UT) tablet Take 2,000 Units by mouth daily.  03/27/20  Yes [provider]  folic acid (FOLVITE) 1 MG tablet Take 1 mg by mouth daily. 06/15/20  Yes [provider]  Hypochlorous Acid 0.012 % SOLN Apply 1 spray topically in the morning and at bedtime.   Yes [provider]  losartan (COZAAR) 100 MG tablet Take 100 mg by mouth daily. 07/06/20  Yes [provider]  metFORMIN (GLUCOPHAGE) 1000 MG tablet Take 1,000 mg by mouth 2 (two) times daily. 06/15/20  Yes [provider]  metoprolol tartrate (LOPRESSOR) 25 MG tablet Take 12.5 mg by mouth 2 (two) times daily.  06/15/20  Yes [provider]  Polyethyl Glycol-Propyl Glycol (SYSTANE) 0.4-0.3 % SOLN Apply 1 drop to eye 4 (four) times daily as needed (dry eyes).   Yes [provider]  simvastatin (ZOCOR) 40 MG tablet Take 40 mg by mouth at bedtime. 05/24/20  Yes [provider]  thiamine 100 MG tablet Take 200 mg by mouth daily.   Yes [provider]  amLODipine (NORVASC) 5 MG tablet Take 1 tablet (5 mg total) by mouth daily. 03/16/12 03/16/13  Edsel Petrin, DO  Calcium-Magnesium (CALCIUM MAGNESIUM 750) 300-300 MG TABS Take 1 tablet by mouth daily. Patient not taking: Reported on 07/31/2016 03/16/12  Edsel Petrin, DO  cloNIDine (CATAPRES) 0.2 MG tablet Take 1 tablet (0.2 mg total) by mouth 2 (two) times daily. Patient not taking: Reported on 07/31/2016 03/16/12 03/16/13  Edsel Petrin, DO  metoprolol succinate (TOPROL XL) 100 MG 24 hr tablet Take 1 tablet (100 mg total) by mouth daily. Take with or immediately following a meal. Patient not taking: Reported on 07/31/2016 03/16/12 03/16/13  Edsel Petrin, DO  omeprazole (PRILOSEC) 20 MG capsule Take 1 capsule (20 mg total) by mouth daily. Patient not taking: Reported on 07/31/2016 03/16/12 03/16/13  Edsel Petrin, DO   potassium chloride SA (K-DUR,KLOR-CON) 20 MEQ tablet Take 2 tablets (40 mEq total) by mouth daily. Patient not taking: Reported on 07/31/2016 03/16/12 03/16/13  Edsel Petrin, DO  simvastatin (ZOCOR) 20 MG tablet Take 1 tablet (20 mg total) by mouth daily at 6 PM. Patient not taking: Reported on 07/31/2016 03/16/12 03/16/13  Edsel Petrin, DO    Allergies    Patient has no known allergies.  Review of Systems   Review of Systems  Constitutional: Negative for chills and fever.  HENT: Negative for ear pain and sore throat.   Eyes: Negative for pain and visual disturbance.  Respiratory: Negative for cough and shortness of breath.   Cardiovascular: Negative for chest pain and palpitations.  Gastrointestinal: Positive for abdominal pain and nausea. Negative for diarrhea and vomiting.  Genitourinary: Negative for dysuria and hematuria.  Musculoskeletal: Negative for arthralgias and back pain.  Skin: Negative for color change and rash.  Neurological: Positive for weakness. Negative for seizures and syncope.  All other systems reviewed and are negative.   Physical Exam Updated Vital Signs BP 113/64   Pulse 91   Temp 98.1 F (36.7 C)   Resp 17   SpO2 100%   Physical Exam Vitals and nursing note reviewed.  Constitutional:      General: She is not in acute distress.    Appearance: She is well-developed. She is ill-appearing. She is not diaphoretic.  HENT:     Head: Normocephalic and atraumatic.  Eyes:     Conjunctiva/sclera: Conjunctivae normal.  Cardiovascular:     Rate and Rhythm: Normal rate and regular rhythm.     Heart sounds: Normal heart sounds. No murmur heard.   Pulmonary:     Effort: Pulmonary effort is normal. No respiratory distress.     Breath sounds: Normal breath sounds.  Abdominal:     General: There is no distension.     Palpations: Abdomen is soft.     Tenderness: There is generalized abdominal tenderness. There is right CVA tenderness and left CVA  tenderness. There is no guarding. Negative signs include Murphy's sign.     Hernia: A hernia is present.  Musculoskeletal:     Cervical back: Neck supple.  Skin:    General: Skin is warm and dry.     Coloration: Skin is not pale.     Findings: No erythema.  Neurological:     Mental Status: She is alert.     ED Results / Procedures / Treatments   Labs (all labs ordered are listed, but only abnormal results are displayed) Labs Reviewed  LIPASE, BLOOD - Abnormal; Notable for the following components:      Result Value   Lipase 921 (*)    All other components within normal limits  COMPREHENSIVE METABOLIC PANEL - Abnormal; Notable for the following components:   Sodium 132 (*)    Potassium 5.5 (*)  CO2 12 (*)    Glucose, Bld 173 (*)    BUN 52 (*)    Creatinine, Ser 6.34 (*)    Total Protein 6.4 (*)    AST 14 (*)    GFR, Estimated 7 (*)    Anion gap 16 (*)    All other components within normal limits  CBC - Abnormal; Notable for the following components:   WBC 12.0 (*)    All other components within normal limits  RESPIRATORY PANEL BY RT PCR (FLU A&B, COVID)  ETHANOL  URINALYSIS, ROUTINE W REFLEX MICROSCOPIC  RAPID URINE DRUG SCREEN, HOSP PERFORMED    EKG EKG Interpretation  Date/Time:  Wednesday July 19 2020 12:02:16 EST Ventricular Rate:  94 PR Interval:  136 QRS Duration: 68 QT Interval:  352 QTC Calculation: 440 R Axis:   81 Text Interpretation: Normal sinus rhythm Biatrial enlargement Abnormal ECG No old tracing to compare Confirmed by Meridee Score [phone]) on 07/19/2020 12:51:04 PM   Radiology US Renal  Result Date: 07/19/2020 CLINICAL DATA:  Acute renal injury EXAM: RENAL / URINARY TRACT ULTRASOUND COMPLETE COMPARISON:  None. FINDINGS: Right Kidney: Renal measurements: 11.4 x 4.9 x 4.7 cm. = volume: 137 mL. Echogenicity within normal limits. No mass or hydronephrosis visualized. Left Kidney: Renal measurements: 11.1 x 5.5 x 6.3 cm. = volume: 198 mL.  Echogenicity within normal limits. No mass or hydronephrosis visualized. Bladder: Appears normal for degree of bladder distention. Other: None. IMPRESSION: No acute abnormality of the kidneys is noted. Electronically Signed   By: Alcide Clever M.D.   On: 07/19/2020 14:49    Procedures .Critical Care Performed by: Mare Ferrari, PA-C Authorized by: Mare Ferrari, PA-C   Critical care provider statement:    Critical care time (minutes):  45   Critical care was necessary to treat or prevent imminent or life-threatening deterioration of the following conditions:  Hepatic failure, metabolic crisis and renal failure   Critical care was time spent personally by me on the following activities:  Discussions with consultants, evaluation of patient's response to treatment, examination of patient, ordering and performing treatments and interventions, ordering and review of laboratory studies, ordering and review of radiographic studies, pulse oximetry, re-evaluation of patient's condition, obtaining history from patient or surrogate and review of old charts   (including critical care time)  Medications Ordered in ED Medications  0.9 %  sodium chloride infusion (has no administration in time range)  sodium chloride 0.9 % bolus 1,000 mL (0 mLs Intravenous Stopped 07/19/20 1442)  sodium chloride 0.9 % bolus 1,000 mL (1,000 mLs Intravenous New Bag/Given 07/19/20 1517)    ED Course  I have reviewed the triage vital signs and the nursing notes.  Pertinent labs & imaging results that were available during my care of the patient were reviewed by me and considered in my medical decision making (see chart for details).  Clinical Course as of Jul 19 1536  Wed Jul 19, 2020  172 59 year old female here with chest and abdominal pain and generalized weakness that started yesterday.  She thinks she has been eating and drinking okay.  Blood pressure is low.  Getting labs EKG fluids CT abdomen and pelvis.   [MB]     Clinical Course User Index [MB] Terrilee Files, MD   MDM Rules/Calculators/A&P                         59 year old female with a history of alcoholism presents  to the ER with complaints of abdominal pain, hypotensive On presentation, she is chronically ill-appearing, however alert, oriented, in no acute distress.  Vitals improved with some fluids.  No evidence of fever.  She is not tachypneic on my exam.  Her abdomen is exquisitely tender to palpation even to light touch.  She has bilateral flank pain as well.  No significant evidence of tension.  DDx includes viral gastroenteritis, pancreatitis, SBP, hepatitis, kidney stone, UTI, infected stone  Labs reviewed and interpreted by me -CBC with a leukocytosis of 12 -CMP with hyponatremia, hyperkalemia of 5.5 and most notably is a creatinine of 6.34 and a BUN of 52.  Review of her labs from 8 years ago showed a normal baseline creatinine.  Her AST and ALT are normal.  Elevated anion gap of 16. -Her lipase is 921. -Negative ethanol -Negative Covid -Bladder scan with 41 cc of urine no evidence of retention  IMAGING: Given elevated creatinine,  ultrasound of the kidneys was ordered.  Personally reviewed this, this was normal.  CT of the abdomen is pending.  Consults: Spoke with nephrology Dr. Arrie Aranoladonato who will see and evaluate the patient.  Plan to continue giving IV fluids which will likely decrease her hyperkalemia.  We will hold hyperkalemia treatment at this time.  I also spoke with Dr. Dulce Sellarutlaw with [facility] who recommends CT of the abdomen which is already been ordered, they have been made aware of the patient.  They will come see and evaluate her.  MDM: Patient will need admission for AKI in the setting of pancreatitis.  Continuing fluids as directed.  No evidence of infection at this time.  Remains hemodynamically stable at this time.  Spoke with Dr. Ophelia CharterYates with the hospital team who will admit the patient.  CT of the abdomen pelvis  is still pending.  Final Clinical Impression(s) / ED Diagnoses Final diagnoses:  AKI (acute kidney injury) (HCC)  Alcohol-induced acute pancreatitis, unspecified complication status    Rx / DC Orders ED Discharge Orders    None       Leone BrandBelaya, Ilayda Toda A, PA-C 07/19/20 1537    Terrilee FilesButler, Michael C, MD 07/19/20 1913    Mare FerrariBelaya, Dontavius Keim A, PA-C 07/20/20 1249    Terrilee FilesButler, Michael C, MD 07/20/20 [phone]

## 2020-07-19 NOTE — H&P (Signed)
History and Physical    Lisa Blanchard RSW:[id number] DOB: Jan 13, 1961 DOA: 07/19/2020  PCP: [facility] Consultants:  None Patient coming from:  Home - lives with fiance and sister; NOK: Clearence Ped, [phone]   Chief Complaint: Weakness, abdominal pain  HPI: Lisa Blanchard is a 59 y.o. female with medical history significant of HTN and ETOH dependence presenting with weakness and abdominal pain.  She reports that she "felt like crap" starting Sunday evening.  The pain started Monday evening in her left upper quadrant and upper abdomen.  No n/v.  No diarrhea.  She has never felt like this before, it was very scary.  Her last drink was Sunday evening, just a sip or two.  No blood.  She did have mild dry heaves and one episode of very mild emesis.    ED Course: h/o ETOH pancreatitis, presenting with the same.  Drinks 2 glasses of gin nightly.  Diffuse abdominal pain.  Creatinine normal 8ya, now 6.  Nephrology and GI consulted.  Abdominal CT pending.  Review of Systems: As per HPI; otherwise review of systems reviewed and negative.   Ambulatory Status:  Ambulates without assistance  COVID Vaccine Status:  Complete - J&J  Past Medical History:  Diagnosis Date  . Alcohol abuse 03/14/2012  . HTN (hypertension) 03/14/2012  . Smoking hx 03/14/2012    Past Surgical History:  Procedure Laterality Date  . APPENDECTOMY      Social History   Socioeconomic History  . Marital status: Divorced    Spouse name: Not on file  . Number of children: Not on file  . Years of education: Not on file  . Highest education level: Not on file  Occupational History  . Occupation: retired  Tobacco Use  . Smoking status: Current Every Day Smoker    Packs/day: 1.00    Years: 46.00    Pack years: 46.00  . Smokeless tobacco: Never Used  Substance and Sexual Activity  . Alcohol use: Yes    Comment: 16 oz gin nightly  . Drug use: No  . Sexual activity: Not on file  Other  Topics Concern  . Not on file  Social History Narrative  . Not on file   Social Determinants of Health   Financial Resource Strain:   . Difficulty of Paying Living Expenses: Not on file  Food Insecurity:   . Worried About Programme researcher, broadcasting/film/video in the Last Year: Not on file  . Ran Out of Food in the Last Year: Not on file  Transportation Needs:   . Lack of Transportation (Medical): Not on file  . Lack of Transportation (Non-Medical): Not on file  Physical Activity:   . Days of Exercise per Week: Not on file  . Minutes of Exercise per Session: Not on file  Stress:   . Feeling of Stress : Not on file  Social Connections:   . Frequency of Communication with Friends and Family: Not on file  . Frequency of Social Gatherings with Friends and Family: Not on file  . Attends Religious Services: Not on file  . Active Member of Clubs or Organizations: Not on file  . Attends Banker Meetings: Not on file  . Marital Status: Not on file  Intimate Partner Violence:   . Fear of Current or Ex-Partner: Not on file  . Emotionally Abused: Not on file  . Physically Abused: Not on file  . Sexually Abused: Not on file    No Known Allergies  History reviewed. No pertinent family history.  Prior to Admission medications   Medication Sig Start Date End Date Taking? Authorizing Provider  aspirin EC 81 MG tablet Take 81 mg by mouth daily. Swallow whole.   Yes [provider]  Cholecalciferol (VITAMIN D) 50 MCG (2000 UT) tablet Take 2,000 Units by mouth daily.  03/27/20  Yes [provider]  folic acid (FOLVITE) 1 MG tablet Take 1 mg by mouth daily. 06/15/20  Yes [provider]  Hypochlorous Acid 0.012 % SOLN Apply 1 spray topically in the morning and at bedtime.   Yes [provider]  losartan (COZAAR) 100 MG tablet Take 100 mg by mouth daily. 07/06/20  Yes [provider]  metFORMIN (GLUCOPHAGE) 1000 MG tablet Take 1,000 mg by mouth 2 (two)  times daily. 06/15/20  Yes [provider]  metoprolol tartrate (LOPRESSOR) 25 MG tablet Take 12.5 mg by mouth 2 (two) times daily.  06/15/20  Yes [provider]  Polyethyl Glycol-Propyl Glycol (SYSTANE) 0.4-0.3 % SOLN Apply 1 drop to eye 4 (four) times daily as needed (dry eyes).   Yes [provider]  simvastatin (ZOCOR) 40 MG tablet Take 40 mg by mouth at bedtime. 05/24/20  Yes [provider]  thiamine 100 MG tablet Take 200 mg by mouth daily.   Yes [provider]  amLODipine (NORVASC) 5 MG tablet Take 1 tablet (5 mg total) by mouth daily. 03/16/12 03/16/13  Edsel PetrinGolding, Elizabeth L, DO  Calcium-Magnesium (CALCIUM MAGNESIUM 750) 300-300 MG TABS Take 1 tablet by mouth daily. Patient not taking: Reported on 07/31/2016 03/16/12   Edsel PetrinGolding, Elizabeth L, DO  cloNIDine (CATAPRES) 0.2 MG tablet Take 1 tablet (0.2 mg total) by mouth 2 (two) times daily. Patient not taking: Reported on 07/31/2016 03/16/12 03/16/13  Edsel PetrinGolding, Elizabeth L, DO  metoprolol succinate (TOPROL XL) 100 MG 24 hr tablet Take 1 tablet (100 mg total) by mouth daily. Take with or immediately following a meal. Patient not taking: Reported on 07/31/2016 03/16/12 03/16/13  Edsel PetrinGolding, Elizabeth L, DO  omeprazole (PRILOSEC) 20 MG capsule Take 1 capsule (20 mg total) by mouth daily. Patient not taking: Reported on 07/31/2016 03/16/12 03/16/13  Edsel PetrinGolding, Elizabeth L, DO  potassium chloride SA (K-DUR,KLOR-CON) 20 MEQ tablet Take 2 tablets (40 mEq total) by mouth daily. Patient not taking: Reported on 07/31/2016 03/16/12 03/16/13  Edsel PetrinGolding, Elizabeth L, DO  simvastatin (ZOCOR) 20 MG tablet Take 1 tablet (20 mg total) by mouth daily at 6 PM. Patient not taking: Reported on 07/31/2016 03/16/12 03/16/13  Edsel PetrinGolding, Elizabeth L, DO    Physical Exam: Vitals:   07/19/20 1530 07/19/20 1615 07/19/20 1630 07/19/20 1701  BP: (!) 151/72 104/63 (!) 101/59 109/62  Pulse: 92 96 97 93  Resp: 18 (!) 25 (!) 34   Temp:      SpO2: 100% 100% 100%       . General:  Appears calm and comfortable and is NAD; appears older than stated age . Eyes:  PERRL, EOMI, normal lids, iris . ENT:  grossly normal hearing, lips & tongue (black discoloration on some of tongue), mmm . Neck:  no LAD, masses or thyromegaly . Cardiovascular:  RRR, no m/r/g. No LE edema.  Marland Kitchen. Respiratory:   CTA bilaterally with no wheezes/rales/rhonchi.  Mildly increased respiratory effort. . Abdomen:  Diffusely exquisitely TTP, possibly worst in RUQ > LUQ  . Back:   normal alignment, no CVAT . Musculoskeletal:  grossly normal tone BUE/BLE, good ROM, no bony abnormality . Psychiatric:  blunted mood and affect, speech fluent and appropriate, AOx3 . Neurologic:  CN 2-12 grossly intact, moves all extremities in coordinated fashion    Radiological Exams on Admission: Independently reviewed - see discussion in A/P where applicable  CT ABDOMEN PELVIS WO CONTRAST  Result Date: 07/19/2020 CLINICAL DATA:  Abdominal pain, acute renal insufficiency EXAM: CT ABDOMEN AND PELVIS WITHOUT CONTRAST TECHNIQUE: Multidetector CT imaging of the abdomen and pelvis was performed following the standard protocol without IV contrast. COMPARISON:  07/19/2020, 03/14/2012 FINDINGS: Lower chest: No acute pleural or parenchymal lung disease. Hepatobiliary: Calcified gallstone without cholecystitis. Liver is unremarkable. Pancreas: Unremarkable. No pancreatic ductal dilatation or surrounding inflammatory changes. Spleen: Normal in size without focal abnormality. Adrenals/Urinary Tract: Adrenal glands are unremarkable. Kidneys are normal, without renal calculi, focal lesion, or hydronephrosis. Bladder is unremarkable. Stomach/Bowel: No bowel obstruction or ileus. No bowel wall thickening or inflammatory change. The appendix is surgically absent. Vascular/Lymphatic: Aortic atherosclerosis. No enlarged abdominal or pelvic lymph nodes. Reproductive: Uterus and bilateral adnexa are unremarkable. Other: No free  fluid or free gas.  No abdominal wall hernia. Musculoskeletal: No acute or destructive bony lesions. Reconstructed images demonstrate no additional findings. IMPRESSION: 1. Cholelithiasis without cholecystitis. 2. No evidence of urinary tract calculi or obstructive uropathy. 3.  Aortic Atherosclerosis (ICD10-I70.0). Electronically Signed   By: Sharlet Salina M.D.   On: 07/19/2020 16:00   US Renal  Result Date: 07/19/2020 CLINICAL DATA:  Acute renal injury EXAM: RENAL / URINARY TRACT ULTRASOUND COMPLETE COMPARISON:  None. FINDINGS: Right Kidney: Renal measurements: 11.4 x 4.9 x 4.7 cm. = volume: 137 mL. Echogenicity within normal limits. No mass or hydronephrosis visualized. Left Kidney: Renal measurements: 11.1 x 5.5 x 6.3 cm. = volume: 198 mL. Echogenicity within normal limits. No mass or hydronephrosis visualized. Bladder: Appears normal for degree of bladder distention. Other: None. IMPRESSION: No acute abnormality of the kidneys is noted. Electronically Signed   By: Alcide Clever M.D.   On: 07/19/2020 14:49    EKG: Independently reviewed.  NSR with rate 94; biatrial enlargement with no evidence of acute ischemia   Labs on Admission: I have personally reviewed the available labs and imaging studies at the time of the admission.  Pertinent labs:   Na++ 132 K+ 5.5 CO2 12 Glucose 173 BUN 52/Creatinine 6.34/GFR 7 Anion gap 16 Lipase 921 AST 14/ALT 9 WBC 12.0 EOTH <10 COVID/flu negative   Assessment/Plan Principal Problem:   Acute alcoholic pancreatitis Active Problems:   Alcohol dependence with uncomplicated withdrawal (HCC)   Tobacco dependence   HTN (hypertension)   Hyperlipidemia   Acute renal failure (ARF) (HCC)   Acute alcoholic pancreatitis -Patient with prior h/o acute idiopathic pancreatitis in  2013 presenting with weakness and abdominal pain -Condition is suggestive of pancreatitis by H&P, elevated lipase, but CT does not show evidence of pancreatic inflammation; since  she has 2 features positive, this is still likely acute alcoholic pancreatitis -GI has consulted -She did have gallstones without cholelithiasis on CT and may benefit from MRCP; will defer to GI -Will admit to progressive for now given marginal BPs in the ER -Would make strict NPO for now, although GI has ordered clear liquid diet - will defer to GI -Aggressive IVF hydration at least for the first 12 hours with NS at 150 cc/hr -Pain control with morphine 2 mg q2h prn.   -Nausea control with Zofran  Acute renal failure -Normal/low creatinine in 2013; she has little muscle mass and so low creatinine would be generally expected -  Today's creatinine is 6.34 -Likely due to prerenal failure secondary to dehydration in the setting of alcohol use and pancreatitis, and continuation of ARB, Metformin -Hold nephrotoxic medications for now -US-renal unremarkable -Follow up renal function by [facility] -Nephrology is consulting and has recommended -Records requested from [place] to ascertain most recent baseline creatinine  ETOH dependence -Patient with chronic ETOH dependence -Number of drinks per day: 16 oz of liquor -Number of admissions for management of alcohol withdrawal syndrome (detox): 1 -History of withdrawal seizures, ICU admissions, DTs: 0 -Patient is exhibiting active s/sx of withdrawal, CIWA score is 0 -CIWA protocol -Folate, thiamine, and MVI ordered -Will provide symptom-triggered BZD (ativan per CIWA protocol) only since the patient is able to communicate; is not showing current signs of delirium; and has no history of severe withdrawal. -TOC team consult for possible inpatient treatment -Will also check UDS. -Consider offering a medication for Alcohol Use Disorder at the time of d/c, to include Disulfuram; Naltrexone; or Acamprosate.  DM -Will check A1c -hold Glucophage -Cover with moderate-scale SSI  HTN -Borderline low BP while in ER -Hold home Cozaar, Lopressor, Catapres, and Toprol  and resume when BP has normalized and is accelerating -There is not a clear indication for ASA, will hold  HLD -Hold Zocor for now given low likelihood of causing pancreatitis  Tobacco dependence -Encourage cessation.   -This was discussed with the patient and should be reviewed on an ongoing basis.  -Patch declined by patient.  DNR -I have discussed code status with the patient and she would not desire resuscitation and would prefer to die a natural death should that situation arise. -She will need a gold out of facility DNR form at the time of discharge    Note: This patient has been tested and is negative for the novel coronavirus COVID-19. The patient has been fully vaccinated against COVID-19.    DVT prophylaxis:  Heparin Code Status:  DNR - confirmed with patient Family Communication: None present; I spoke with the patient's husband by telephone at the time of admission. Disposition Plan:  The patient is from: home  Anticipated d/c is to: home without [facility] services   Anticipated d/c date will depend on clinical response to treatment, but likely 2-3 days  Patient is currently: acutely ill Consults called: GI; nephrology; TOC team Admission status:  Admit - It is my clinical opinion that admission to INPATIENT is reasonable and necessary because of the expectation that this patient will require hospital care that crosses at least 2 midnights to treat this condition based on the medical complexity of the problems presented.  Given the aforementioned information, the predictability of an adverse outcome is felt to be significant.    Jonah Blue MD [facility]   How to contact the [facility] Attending or Consulting provider 7A - 7P or covering provider during after hours 7P -7A, for this patient?  1. Check the care team in [facility] and look for a) attending/consulting TR[facility] provider listed and b) the [facility] team listed 2. Log into [email or web address] and use [place]'s universal password  to access. If you do not have the password, please contact the hospital operator. 3. Locate the [employer] provider you are looking for under [facility] and page to a number that you can be directly reached. 4. If you still have difficulty reaching the provider, please page the [facility] (Director on Call) for the Hospitalists listed on amion for assistance.   07/19/2020, 5:07 PM

## 2020-07-19 NOTE — Consult Note (Addendum)
Referring Provider: ED Primary Care Physician:  Default, Provider, MD Primary Gastroenterologist:  Gentry Fitz  Reason for Consultation:  Acute pancreatitis  HPI: Lisa Blanchard is a 59 y.o. female with past medical history of alcohol induced pancreatitis (2013) and alcohol use presenting for consultation of acute pancreatitis.  Patient stated she started to feel "yucky" on Sunday 11/7 but felt progressively worse over the last few days, and thus presented to the ED today.  She reports pain across her entire abdomen.  She also notes a decreased appetite and has consumed mostly clear liquids for the past week.  Denies any nausea or vomiting.  States her weight has remained stable over the last few years.  Patient denies any diarrhea, constipation, melena, or hematochezia.  Notes decreased urine output over the last several days.  Patient reports history of pancreatitis in 2013 due to alcohol use.  She currently drinks 1 glass of gin nightly.  She has done so for many years.   Past Medical History:  Diagnosis Date  . Alcohol abuse 03/14/2012  . HTN (hypertension) 03/14/2012  . Hypertension   . Smoking hx 03/14/2012    Past Surgical History:  Procedure Laterality Date  . APPENDECTOMY      Prior to Admission medications   Medication Sig Start Date End Date Taking? Authorizing Provider  aspirin EC 81 MG tablet Take 81 mg by mouth daily. Swallow whole.   Yes [provider]  Cholecalciferol (VITAMIN D) 50 MCG (2000 UT) tablet Take 2,000 Units by mouth daily.  03/27/20  Yes [provider]  folic acid (FOLVITE) 1 MG tablet Take 1 mg by mouth daily. 06/15/20  Yes [provider]  Hypochlorous Acid 0.012 % SOLN Apply 1 spray topically in the morning and at bedtime.   Yes [provider]  losartan (COZAAR) 100 MG tablet Take 100 mg by mouth daily. 07/06/20  Yes [provider]  metFORMIN (GLUCOPHAGE) 1000 MG tablet Take 1,000 mg by mouth 2 (two) times  daily. 06/15/20  Yes [provider]  metoprolol tartrate (LOPRESSOR) 25 MG tablet Take 12.5 mg by mouth 2 (two) times daily.  06/15/20  Yes [provider]  Polyethyl Glycol-Propyl Glycol (SYSTANE) 0.4-0.3 % SOLN Apply 1 drop to eye 4 (four) times daily as needed (dry eyes).   Yes [provider]  simvastatin (ZOCOR) 40 MG tablet Take 40 mg by mouth at bedtime. 05/24/20  Yes [provider]  thiamine 100 MG tablet Take 200 mg by mouth daily.   Yes [provider]  amLODipine (NORVASC) 5 MG tablet Take 1 tablet (5 mg total) by mouth daily. 03/16/12 03/16/13  Edsel Petrin, DO  Calcium-Magnesium (CALCIUM MAGNESIUM 750) 300-300 MG TABS Take 1 tablet by mouth daily. Patient not taking: Reported on 07/31/2016 03/16/12   Edsel Petrin, DO  cloNIDine (CATAPRES) 0.2 MG tablet Take 1 tablet (0.2 mg total) by mouth 2 (two) times daily. Patient not taking: Reported on 07/31/2016 03/16/12 03/16/13  Edsel Petrin, DO  metoprolol succinate (TOPROL XL) 100 MG 24 hr tablet Take 1 tablet (100 mg total) by mouth daily. Take with or immediately following a meal. Patient not taking: Reported on 07/31/2016 03/16/12 03/16/13  Edsel Petrin, DO  omeprazole (PRILOSEC) 20 MG capsule Take 1 capsule (20 mg total) by mouth daily. Patient not taking: Reported on 07/31/2016 03/16/12 03/16/13  Edsel Petrin, DO  potassium chloride SA (K-DUR,KLOR-CON) 20 MEQ tablet Take 2 tablets (40 mEq total) by mouth daily. Patient  not taking: Reported on 07/31/2016 03/16/12 03/16/13  Edsel Petrin, DO  simvastatin (ZOCOR) 20 MG tablet Take 1 tablet (20 mg total) by mouth daily at 6 PM. Patient not taking: Reported on 07/31/2016 03/16/12 03/16/13  Edsel Petrin, DO    Scheduled Meds: Continuous Infusions: . sodium chloride     PRN Meds:.  Allergies as of 07/19/2020  . (No Known Allergies)    No family history on file.  Social History   Socioeconomic History  .  Marital status: Divorced    Spouse name: Not on file  . Number of children: Not on file  . Years of education: Not on file  . Highest education level: Not on file  Occupational History  . Not on file  Tobacco Use  . Smoking status: Current Every Day Smoker  . Smokeless tobacco: Never Used  Substance and Sexual Activity  . Alcohol use: Yes  . Drug use: No  . Sexual activity: Not on file  Other Topics Concern  . Not on file  Social History Narrative  . Not on file   Social Determinants of Health   Financial Resource Strain:   . Difficulty of Paying Living Expenses: Not on file  Food Insecurity:   . Worried About Programme researcher, broadcasting/film/video in the Last Year: Not on file  . Ran Out of Food in the Last Year: Not on file  Transportation Needs:   . Lack of Transportation (Medical): Not on file  . Lack of Transportation (Non-Medical): Not on file  Physical Activity:   . Days of Exercise per Week: Not on file  . Minutes of Exercise per Session: Not on file  Stress:   . Feeling of Stress : Not on file  Social Connections:   . Frequency of Communication with Friends and Family: Not on file  . Frequency of Social Gatherings with Friends and Family: Not on file  . Attends Religious Services: Not on file  . Active Member of Clubs or Organizations: Not on file  . Attends Banker Meetings: Not on file  . Marital Status: Not on file  Intimate Partner Violence:   . Fear of Current or Ex-Partner: Not on file  . Emotionally Abused: Not on file  . Physically Abused: Not on file  . Sexually Abused: Not on file    Review of Systems:Review of Systems  Constitutional: Negative for chills, fever and weight loss.  HENT: Negative for hearing loss and tinnitus.   Eyes: Negative for pain and redness.  Respiratory: Negative for cough and shortness of breath.   Cardiovascular: Negative for chest pain.  Gastrointestinal: Positive for abdominal pain. Negative for blood in stool,  constipation, diarrhea, heartburn, melena, nausea and vomiting.  Genitourinary: Positive for frequency (decreased). Negative for hematuria.  Musculoskeletal: Negative for falls and joint pain.  Skin: Negative for itching and rash.  Neurological: Negative for seizures and loss of consciousness.  Endo/Heme/Allergies: Negative for polydipsia. Does not bruise/bleed easily.  Psychiatric/Behavioral: Positive for substance abuse (ETOH). The patient is not nervous/anxious.     Physical Exam: Vital signs: Vitals:   07/19/20 1430 07/19/20 1500  BP: 105/72 113/64  Pulse: 88 91  Resp: (!) 24 17  Temp:    SpO2: 100% 100%     Physical Exam Vitals reviewed.  Constitutional:      General: She is not in acute distress.    Appearance: She is cachectic.  HENT:     Head: Normocephalic and atraumatic.  Nose: Nose normal. No congestion.     Mouth/Throat:     Mouth: Mucous membranes are moist.     Pharynx: Oropharynx is clear.  Eyes:     General: No scleral icterus.    Extraocular Movements: Extraocular movements intact.     Conjunctiva/sclera: Conjunctivae normal.  Cardiovascular:     Rate and Rhythm: Normal rate and regular rhythm.     Pulses: Normal pulses.     Heart sounds: Normal heart sounds.  Pulmonary:     Effort: Pulmonary effort is normal. No respiratory distress.     Breath sounds: Normal breath sounds.  Abdominal:     General: Bowel sounds are normal. There is no distension.     Palpations: Abdomen is soft. There is no mass.     Tenderness: There is abdominal tenderness (moderate, epigastric). There is guarding. There is no rebound.     Hernia: No hernia is present.  Musculoskeletal:        General: No swelling or tenderness.     Cervical back: Normal range of motion and neck supple.  Skin:    General: Skin is warm and dry.     Coloration: Skin is not jaundiced.  Neurological:     General: No focal deficit present.     Mental Status: She is oriented to person, place,  and time. She is lethargic.  Psychiatric:        Mood and Affect: Mood normal.        Behavior: Behavior normal. Behavior is cooperative.     GI:  Lab Results: Recent Labs    07/19/20 1211  WBC 12.0*  HGB 12.0  HCT 38.0  PLT 375   BMET Recent Labs    07/19/20 1211  NA 132*  K 5.5*  CL 104  CO2 12*  GLUCOSE 173*  BUN 52*  CREATININE 6.34*  CALCIUM 9.2   LFT Recent Labs    07/19/20 1211  PROT 6.4*  ALBUMIN 3.5  AST 14*  ALT 9  ALKPHOS 41  BILITOT 1.2   PT/INR No results for input(s): LABPROT, INR in the last 72 hours.   Studies/Results: US Renal  Result Date: 07/19/2020 CLINICAL DATA:  Acute renal injury EXAM: RENAL / URINARY TRACT ULTRASOUND COMPLETE COMPARISON:  None. FINDINGS: Right Kidney: Renal measurements: 11.4 x 4.9 x 4.7 cm. = volume: 137 mL. Echogenicity within normal limits. No mass or hydronephrosis visualized. Left Kidney: Renal measurements: 11.1 x 5.5 x 6.3 cm. = volume: 198 mL. Echogenicity within normal limits. No mass or hydronephrosis visualized. Bladder: Appears normal for degree of bladder distention. Other: None. IMPRESSION: No acute abnormality of the kidneys is noted. Electronically Signed   By: Alcide Clever M.D.   On: 07/19/2020 14:49    Impression: Acute pancreatitis: Abdominal pain and lipase elevated to 921.  Presumably due to alcohol use.  Normal LFTs. -WBCs 12.0 -Pt has received two 1000L boluses of IVF  AKI: BUN 52/creatinine 6.34  Plan: IV fluids at a rate of 150 cc/h.  Await CT.  Supportive care and pain control.  Clear liquid diet.  [facility] will follow.   LOS: 0 days   Edrick Kins  PA-C 07/19/2020, 3:17 PM  Contact #  [phone]

## 2020-07-19 NOTE — ED Notes (Signed)
Patient transported to Ultrasound 

## 2020-07-19 NOTE — Consult Note (Addendum)
Nephrology Follow-Up Consult note   Assessment/Recommendations: Lisa Blanchard is a/an 59 y.o. female with a past medical history significant for HTN, HLD, DM, EtOH use disorder, pancreatitis, tobacco use.   AKI: 59 yo patient presents today with cr significantly elevated to 6.34, BUN elevated to 52. The severity of AKI is likely the cause for bicarb wasting and elevated potassium. Unclear patient's baseline creatinine as she is followed at the [place] and we do not have records. However, most likely prerenal etiology in the setting of dehydration due to pancreatitis, vomiting, hemodynamic changes with losartan and metformin home medications. Patient denies NSAID use. Patient is very dry on exam, cracked lips and tongue. Patient has received 2L NS bolus, recommend fluid rehydration with maintenance IVF as well as sodium bicarb replacement. Patient does not require urgent dialysis at this time, expect improvement with rehydration. However, we will follow along for improvement. -NS 100 mL/hr  -Chart reviewed: patient is on losartan and metformin at home. Recommend holding these medications while kidney function is so poor. Patient has not had hypotension, did not receive contrast with CT. -Continue to monitor daily Cr, Dose meds for GFR -Monitor Daily I/Os, Daily weight  -Maintain MAP>65 for optimal renal perfusion.  -Avoid nephrotoxic medications including NSAIDs and Vanc/Zosyn combo -UA and CT a/p w/o contrast pending, will follow up results -Currently no indication for HD -Daily BMP, phosphorus, mg  Pancreatitis: Patient has h/o EtOH pancreatitis in 10-Jan-2012. Lipase elevated to 921 today. Recommend continued rehydration, NPO, pain management. CT a/p w/o contrast pending. Patient reports that she was to have a RUQ [place] per [place] PCP this week. - per primary team - GI consulted  Hypertension: Patient has h/o hypertension, but she has ranged from normotensive to hypotensive here. Initial BP 85/55, she reports  she did not take losartan this AM due to nausea. S/p 2L NS bolus and BP improved to 113/64 more recently. Patient is on Losartan 100 mg at home. - Continue to monitor  Diabetes Mellitus Type 2: patient has diabetes, unknown A1c and most recent creatinine. Patient is hyperglycemic today at 173. Recommend holding home medication and can include SSI if needed.  - Continue to monitor -Add SSI if needed  EtOH use disorder: Patient has a history of EtOH use disorder since at least 2012/01/10.  She reports that she was drinking this weekend, but had not drank in 2 days prior to presentation.  Recommend substance use support and education.  Of note she reports that her sister recently passed away, funeral is next 01-10-2023. -per primary team -Recommend transitions of care for substance use d/o resources  Recommendations conveyed to primary service.   Lisa Mylar, MD PGY-2 07/19/2020 2:17 PM  ___________________________________________________________  CC: AKI  Interval History/Subjective: 59 year old woman presents today with abdominal pain, nausea and vomiting x2 days.  She reports that she was drinking alcohol this weekend, was not able to drink as much as she usually does due to some nausea and abdominal pain.  This progressed and she began having vomiting, worsening nausea, dry heaving on Monday night.  Abdominal pain and vomiting became worse patient was not able to tolerate by Wednesday, prompting presentation to the ED.  She reports that she has not had as much urine output as she normally does, no change in color, no frothiness.  She also reports that she recently saw her doctor at the [place] on Friday, and they recommended that she have an ultrasound of her liver.  Her home medications include losartan 100  mg qd and Metformin 1000mg  BID.  She had not taking any NSAIDs prior to presentation.  Medications:  No current facility-administered medications for this encounter.   Current Outpatient  Medications  Medication Sig Dispense Refill  . amLODipine (NORVASC) 5 MG tablet Take 1 tablet (5 mg total) by mouth daily. 30 tablet 3  . Calcium-Magnesium (CALCIUM MAGNESIUM 750) 300-300 MG TABS Take 1 tablet by mouth daily. (Patient not taking: Reported on 07/31/2016) 30 each 3  . cloNIDine (CATAPRES) 0.2 MG tablet Take 1 tablet (0.2 mg total) by mouth 2 (two) times daily. (Patient not taking: Reported on 07/31/2016) 60 tablet 3  . metoprolol succinate (TOPROL XL) 100 MG 24 hr tablet Take 1 tablet (100 mg total) by mouth daily. Take with or immediately following a meal. (Patient not taking: Reported on 07/31/2016) 30 tablet 3  . omeprazole (PRILOSEC) 20 MG capsule Take 1 capsule (20 mg total) by mouth daily. (Patient not taking: Reported on 07/31/2016) 30 capsule 3  . potassium chloride SA (K-DUR,KLOR-CON) 20 MEQ tablet Take 2 tablets (40 mEq total) by mouth daily. (Patient not taking: Reported on 07/31/2016) 30 tablet 0  . simvastatin (ZOCOR) 20 MG tablet Take 1 tablet (20 mg total) by mouth daily at 6 PM. (Patient not taking: Reported on 07/31/2016) 30 tablet 3      Review of Systems: 10 systems reviewed and negative except per interval history/subjective  Physical Exam: Vitals:   07/19/20 1330 07/19/20 1345  BP: 106/62 (!) 107/55  Pulse: 86   Resp: (!) 24 (!) 25  Temp:    SpO2: 100%    No intake/output data recorded. No intake or output data in the 24 hours ending 07/19/20 1417 Constitutional: tired-appearing AA woman resting comfortably in bed, no acute distress ENMT: ears and nose without scars or lesions, dry lips and tongue CV: normal rate, no edema Respiratory: clear to auscultation, normal work of breathing Gastrointestinal: soft, mildly tender in epigastrium, no palpable masses or hernias Skin: no visible lesions or rashes Psych: alert, judgement/insight appropriate, appropriate mood and affect- tearful regarding sister's funeral   Test Results I personally reviewed  new and old clinical labs and radiology tests Lab Results  Component Value Date   NA 132 (L) 07/19/2020   K 5.5 (H) 07/19/2020   CL 104 07/19/2020   CO2 12 (L) 07/19/2020   BUN 52 (H) 07/19/2020   CREATININE 6.34 (H) 07/19/2020   CALCIUM 9.2 07/19/2020   ALBUMIN 3.5 07/19/2020   PHOS 3.8 03/16/2012

## 2020-07-20 DIAGNOSIS — N179 Acute kidney failure, unspecified: Secondary | ICD-10-CM

## 2020-07-20 DIAGNOSIS — F10230 Alcohol dependence with withdrawal, uncomplicated: Secondary | ICD-10-CM

## 2020-07-20 DIAGNOSIS — K852 Alcohol induced acute pancreatitis without necrosis or infection: Secondary | ICD-10-CM

## 2020-07-20 DIAGNOSIS — F17200 Nicotine dependence, unspecified, uncomplicated: Secondary | ICD-10-CM

## 2020-07-20 DIAGNOSIS — I10 Essential (primary) hypertension: Secondary | ICD-10-CM

## 2020-07-20 DIAGNOSIS — E782 Mixed hyperlipidemia: Secondary | ICD-10-CM

## 2020-07-20 LAB — HEPATIC FUNCTION PANEL
ALT: 7 U/L (ref 0–44)
AST: 11 U/L — ABNORMAL LOW (ref 15–41)
Albumin: 2.8 g/dL — ABNORMAL LOW (ref 3.5–5.0)
Alkaline Phosphatase: 33 U/L — ABNORMAL LOW (ref 38–126)
Bilirubin, Direct: <0.1 mg/dL (ref 0.0–0.2)
Total Bilirubin: 1.2 mg/dL (ref 0.3–1.2)
Total Protein: 5.1 g/dL — ABNORMAL LOW (ref 6.5–8.1)

## 2020-07-20 LAB — RENAL FUNCTION PANEL
Albumin: 2.8 g/dL — ABNORMAL LOW (ref 3.5–5.0)
Anion gap: 13 (ref 5–15)
BUN: 43 mg/dL — ABNORMAL HIGH (ref 6–20)
CO2: 16 mmol/L — ABNORMAL LOW (ref 22–32)
Calcium: 7.8 mg/dL — ABNORMAL LOW (ref 8.9–10.3)
Chloride: 109 mmol/L (ref 98–111)
Creatinine, Ser: 5.38 mg/dL — ABNORMAL HIGH (ref 0.44–1.00)
GFR, Estimated: 9 mL/min — ABNORMAL LOW (ref >60)
Glucose, Bld: 108 mg/dL — ABNORMAL HIGH (ref 70–99)
Phosphorus: 5 mg/dL — ABNORMAL HIGH (ref 2.5–4.6)
Potassium: 4.7 mmol/L (ref 3.5–5.1)
Sodium: 138 mmol/L (ref 135–145)

## 2020-07-20 LAB — URINALYSIS, ROUTINE W REFLEX MICROSCOPIC
Bilirubin Urine: NEGATIVE
Glucose, UA: NEGATIVE mg/dL
Ketones, ur: 5 mg/dL — AB
Leukocytes,Ua: NEGATIVE
Nitrite: NEGATIVE
Protein, ur: 100 mg/dL — AB
Specific Gravity, Urine: 1.009 (ref 1.005–1.030)
pH: 5 (ref 5.0–8.0)

## 2020-07-20 LAB — CBC
HCT: 31.2 % — ABNORMAL LOW (ref 36.0–46.0)
Hemoglobin: 10.1 g/dL — ABNORMAL LOW (ref 12.0–15.0)
MCH: 30.6 pg (ref 26.0–34.0)
MCHC: 32.4 g/dL (ref 30.0–36.0)
MCV: 94.5 fL (ref 80.0–100.0)
Platelets: 313 10*3/uL (ref 150–400)
RBC: 3.3 MIL/uL — ABNORMAL LOW (ref 3.87–5.11)
RDW: 13.8 % (ref 11.5–15.5)
WBC: 11 10*3/uL — ABNORMAL HIGH (ref 4.0–10.5)
nRBC: 0 % (ref 0.0–0.2)

## 2020-07-20 LAB — GLUCOSE, CAPILLARY
Glucose-Capillary: 96 mg/dL (ref 70–99)
Glucose-Capillary: 119 mg/dL — ABNORMAL HIGH (ref 70–99)

## 2020-07-20 LAB — T4, FREE: Free T4: 0.77 ng/dL (ref 0.61–1.12)

## 2020-07-20 LAB — CBG MONITORING, ED
Glucose-Capillary: 89 mg/dL (ref 70–99)
Glucose-Capillary: 92 mg/dL (ref 70–99)

## 2020-07-20 MED ORDER — METOPROLOL TARTRATE 12.5 MG HALF TABLET
12.5000 mg | ORAL_TABLET | Freq: Two times a day (BID) | ORAL | Status: DC
  Administered 2020-07-20 – 2020-07-22 (×4): 12.5 mg via ORAL
  Filled 2020-07-20 (×5): qty 1

## 2020-07-20 NOTE — Progress Notes (Signed)
[facility] Gastroenterology Progress Note  Lisa Blanchard 59 y.o. 1961-03-13  CC:  Acute pancreatitis   Subjective: Patient reports feeling much better today.  Her abdominal pain has decreased.  She is tolerating a clear liquid diet.  ROS : Review of Systems  Cardiovascular: Negative for chest pain and palpitations.  Gastrointestinal: Positive for abdominal pain. Negative for blood in stool, constipation, diarrhea, heartburn, melena, nausea and vomiting.   Objective: Vital signs in last 24 hours: Vitals:   07/20/20 1100 07/20/20 1115  BP: (!) 156/74 (!) 159/77  Pulse: 72 71  Resp: (!) 22 19  Temp:    SpO2: 100% 100%    Physical Exam:  General:  Alert, cooperative, no distress, appears stated age  Head:  Normocephalic, without obvious abnormality, atraumatic  Eyes:  Anicteric sclera, EOMs intact  Lungs:   Clear to auscultation bilaterally, respirations unlabored  Heart:  Regular rate and rhythm, S1, S2 normal  Abdomen:   Soft, mild epigastric tenderness, +bowel sounds, no guarding or peritoneal signs  Extremities: Extremities normal, atraumatic, no  edema  Pulses: 2+ and symmetric    Lab Results: Recent Labs    07/19/20 1211 07/20/20 0307  NA 132* 138  K 5.5* 4.7  CL 104 109  CO2 12* 16*  GLUCOSE 173* 108*  BUN 52* 43*  CREATININE 6.34* 5.38*  CALCIUM 9.2 7.8*  PHOS  --  5.0*   Recent Labs    07/19/20 1211 07/20/20 0307  AST 14* 11*  ALT 9 7  ALKPHOS 41 33*  BILITOT 1.2 1.2  PROT 6.4* 5.1*  ALBUMIN 3.5 2.8*  2.8*   Recent Labs    07/19/20 1211 07/20/20 0307  WBC 12.0* 11.0*  HGB 12.0 10.1*  HCT 38.0 31.2*  MCV 96.9 94.5  PLT 375 313   No results for input(s): LABPROT, INR in the last 72 hours.   Assessment: Acute pancreatitis: Abdominal pain and lipase elevated to 921 (07/19/20).  Presumably due to alcohol use.  Normal LFTs. -WBCs 11.0 today as compared to 12.0 yesterday -Renal function is improving.  BUN 43/Cr 5.38 today as compared to BUN  52/creatinine 6.34 -CT did not show any signs of inflammation, though CT was non-contrasted due to renal function  Plan: Continue IV fluids.  Continue supportive care.  [facility] will follow.  Edrick Kins PA-C 07/20/2020, 11:32 AM  Contact #  [phone]

## 2020-07-20 NOTE — ED Notes (Signed)
Lunch Tray Ordered @ 1034. 

## 2020-07-20 NOTE — Consult Note (Signed)
Nephrology Follow-Up Consult note   Assessment/Recommendations: Lisa Blanchard is a/an 59 y.o. female with a past medical history significant for HTN, HLD, DM, EtOH use disorder, pancreatitis, tobacco use.    AKI: Creatinine improving with fluid resuscitation: cr decreased from 6.34 to 5.38. Electrolytes improved as well, bicarb up to 16 from 12 with sodium bicarb tx. Expect these to continue to improve as AKI improves. Continue to monitor. -NS 150 mL/hr -Sodium Bicarb 150 mEq/1L at 125 mL/hr  -Monitor Daily I/Os, Daily weight  -Maintain MAP>65 for optimal renal perfusion.  -Avoid nephrotoxic medications including NSAIDs and Vanc/Zosyn combo -Currently no indication for HD -Daily BMP, phosphorus, mg  Pancreatitis: Patient has h/o EtOH pancreatitis in 2013. Lipase elevated to 921 at admission. Recommend continued rehydration, NPO, pain management. CT a/p w/o did was non-diagnostic of pancreatitis, but clinical picture, history, and elevated lipase still make this most likely diagnosis.  - per primary team - GI consulted  Anemia: Patient was not anemic at admission, hgb 12.8. However, today dropped to 10.1, suspect this is due to hemoconcentration and true hgb more likely today now that fluid resuscitation underway. MCV WNL, unknown cr baseline, possible iron deficiency vs etoh use (less likely w/o macrocytic anemia) vs AoCD. Recommend continued monitoring. -Daily CBC -Transfuse for hgb <7 g/dL  Hypertension: Patient has h/o hypertension, but she has ranged from normotensive to hypotensive here. Initial BP 85/55, she reports she did not take losartan this AM due to nausea. S/p 2L NS bolus and BP improved to 113/64 more recently. Patient is on Losartan 100 mg at home. - Continue to monitor  Diabetes Mellitus Type 2: patient has diabetes, A1c 5.3%. Recommend follow up with PCP regarding medications.  -Continue to monitor  EtOH use disorder: Patient has a history of EtOH use disorder since  at least 2013.  She reports that she was drinking this weekend, but had not drank in 2 days prior to presentation.  Recommend substance use support and education.  -per primary team -Recommend transitions of care for substance use d/o resources  Recommendations conveyed to primary service.   Shirlean Mylar, MD PGY-2 07/20/2020 8:21 AM  ___________________________________________________________  CC: AKI  Interval History/Subjective: Patient feels much better today, sitting up and looking brighter. No n/v/d overnight.   Medications:  Current Facility-Administered Medications  Medication Dose Route Frequency Provider Last Rate Last Admin  . 0.9 %  sodium chloride infusion   Intravenous Continuous Jonah Blue, MD 150 mL/hr at 07/20/20 0729 New Bag at 07/20/20 0729  . acetaminophen (TYLENOL) tablet 650 mg  650 mg Oral Q6H PRN Jonah Blue, MD       Or  . acetaminophen (TYLENOL) suppository 650 mg  650 mg Rectal Q6H PRN Jonah Blue, MD      . calcium carbonate (dosed in mg elemental calcium) suspension 500 mg of elemental calcium  500 mg of elemental calcium Oral Q6H PRN Jonah Blue, MD      . camphor-menthol [facility]) lotion 1 application  1 application Topical Q8H PRN Jonah Blue, MD       And  . hydrOXYzine (ATARAX/VISTARIL) tablet 25 mg  25 mg Oral Q8H PRN Jonah Blue, MD      . docusate sodium [facility]) enema 283 mg  1 enema Rectal PRN Jonah Blue, MD      . feeding supplement (NEPRO CARB STEADY) liquid 237 mL  237 mL Oral TID PRN Jonah Blue, MD      . folic acid (FOLVITE) tablet 1 mg  1  mg Oral Daily Jonah Blue, MD   1 mg at 07/19/20 1700  . heparin injection 5,000 Units  5,000 Units Subcutaneous Bobette Mo, MD   5,000 Units at 07/20/20 0543  . hydrALAZINE (APRESOLINE) injection 5 mg  5 mg Intravenous Q4H PRN Jonah Blue, MD      . insulin aspart (novoLOG) injection 0-9 Units  0-9 Units Subcutaneous TID WC Jonah Blue, MD      .  LORazepam (ATIVAN) injection 0-4 mg  0-4 mg Intravenous Q6H Jonah Blue, MD       Followed by  . [START ON 07/21/2020] LORazepam (ATIVAN) injection 0-4 mg  0-4 mg Intravenous Steva Colder, MD      . LORazepam (ATIVAN) tablet 1-4 mg  1-4 mg Oral Q1H PRN Jonah Blue, MD       Or  . LORazepam (ATIVAN) injection 1-4 mg  1-4 mg Intravenous Q1H PRN Jonah Blue, MD      . morphine 2 MG/ML injection 2 mg  2 mg Intravenous Q2H PRN Jonah Blue, MD      . multivitamin with minerals tablet 1 tablet  1 tablet Oral Daily Jonah Blue, MD   1 tablet at 07/19/20 1701  . ondansetron (ZOFRAN) tablet 4 mg  4 mg Oral Q6H PRN Jonah Blue, MD       Or  . ondansetron [facility]) injection 4 mg  4 mg Intravenous Q6H PRN Jonah Blue, MD      . polyvinyl alcohol (LIQUIFILM TEARS) 1.4 % ophthalmic solution 1 drop  1 drop Both Eyes QID PRN Jonah Blue, MD      . sodium bicarbonate 150 mEq in sterile water 1,000 mL infusion   Intravenous Continuous Jonah Blue, MD 125 mL/hr at 07/19/20 1713 New Bag at 07/19/20 1713  . sodium chloride flush (NS) 0.9 % injection 3 mL  3 mL Intravenous Q12H Jonah Blue, MD   3 mL at 07/19/20 2209  . sorbitol 70 % solution 30 mL  30 mL Oral PRN Jonah Blue, MD      . thiamine tablet 100 mg  100 mg Oral Daily Jonah Blue, MD   100 mg at 07/19/20 1700   Or  . thiamine (B-1) injection 100 mg  100 mg Intravenous Daily Jonah Blue, MD      . zolpidem Remus Loffler) tablet 5 mg  5 mg Oral QHS PRN Jonah Blue, MD       Current Outpatient Medications  Medication Sig Dispense Refill  . aspirin EC 81 MG tablet Take 81 mg by mouth daily. Swallow whole.    . Cholecalciferol (VITAMIN D) 50 MCG (2000 UT) tablet Take 2,000 Units by mouth daily.     . folic acid (FOLVITE) 1 MG tablet Take 1 mg by mouth daily.    . Hypochlorous Acid 0.012 % SOLN Apply 1 spray topically in the morning and at bedtime.    Marland Kitchen losartan (COZAAR) 100 MG tablet Take 100 mg by  mouth daily.    . metFORMIN (GLUCOPHAGE) 1000 MG tablet Take 1,000 mg by mouth 2 (two) times daily.    . metoprolol tartrate (LOPRESSOR) 25 MG tablet Take 12.5 mg by mouth 2 (two) times daily.     Bertram Gala Glycol-Propyl Glycol (SYSTANE) 0.4-0.3 % SOLN Apply 1 drop to eye 4 (four) times daily as needed (dry eyes).    . simvastatin (ZOCOR) 40 MG tablet Take 40 mg by mouth at bedtime.    . thiamine 100 MG tablet Take 200 mg by  mouth daily.    Marland Kitchen amLODipine (NORVASC) 5 MG tablet Take 1 tablet (5 mg total) by mouth daily. 30 tablet 3  . cloNIDine (CATAPRES) 0.2 MG tablet Take 1 tablet (0.2 mg total) by mouth 2 (two) times daily. (Patient not taking: Reported on 07/31/2016) 60 tablet 3  . metoprolol succinate (TOPROL XL) 100 MG 24 hr tablet Take 1 tablet (100 mg total) by mouth daily. Take with or immediately following a meal. (Patient not taking: Reported on 07/31/2016) 30 tablet 3  . omeprazole (PRILOSEC) 20 MG capsule Take 1 capsule (20 mg total) by mouth daily. (Patient not taking: Reported on 07/31/2016) 30 capsule 3  . potassium chloride SA (K-DUR,KLOR-CON) 20 MEQ tablet Take 2 tablets (40 mEq total) by mouth daily. (Patient not taking: Reported on 07/31/2016) 30 tablet 0  . simvastatin (ZOCOR) 20 MG tablet Take 1 tablet (20 mg total) by mouth daily at 6 PM. (Patient not taking: Reported on 07/31/2016) 30 tablet 3      Review of Systems: 10 systems reviewed and negative except per interval history/subjective  Physical Exam: Vitals:   07/20/20 0729 07/20/20 0810  BP: (!) 148/71 140/71  Pulse: 79 81  Resp:  18  Temp:    SpO2:  100%   No intake/output data recorded.  Intake/Output Summary (Last 24 hours) at 07/20/2020 1914 Last data filed at 07/19/2020 2219 Gross per 24 hour  Intake 62 ml  Output --  Net 62 ml   Constitutional: well-appearing, no acute distress ENMT: ears and nose without scars or lesions, still dry tongue, but mucus membranes are more moist and less cracked than  yesterday CV: normal rate, no edema Respiratory: clear to auscultation, normal work of breathing Gastrointestinal: soft, non-tender, no palpable masses or hernias Skin: no visible lesions or rashes Psych: alert, judgement/insight appropriate, appropriate mood and affect   Test Results I personally reviewed new and old clinical labs and radiology tests Lab Results  Component Value Date   NA 138 07/20/2020   K 4.7 07/20/2020   CL 109 07/20/2020   CO2 16 (L) 07/20/2020   BUN 43 (H) 07/20/2020   CREATININE 5.38 (H) 07/20/2020   CALCIUM 7.8 (L) 07/20/2020   ALBUMIN 2.8 (L) 07/20/2020   ALBUMIN 2.8 (L) 07/20/2020   PHOS 5.0 (H) 07/20/2020

## 2020-07-20 NOTE — Progress Notes (Signed)
Patient transferred from [facility] to [phone] via stretcher; alert and oriented x 4; no complaints of pain; fluids running in LAC; skin intact. Oriented patient to room and unit;; gave patient care guide; instructed how to use the call bell and fall risk precautions.  Will continue to monitor the patient.

## 2020-07-20 NOTE — Progress Notes (Signed)
PROGRESS NOTE  Lisa Blanchard:[id number] DOB: [birth year] DOA: 07/19/2020 PCP: Default, Provider, MD  Brief History   Lisa Blanchard is a 59 y.o. female with medical history significant of HTN and ETOH dependence presenting with weakness and abdominal pain.  She reports that she "felt like crap" starting Sunday evening.  The pain started Monday evening in her left upper quadrant and upper abdomen.  No n/v.  No diarrhea.  She has never felt like this before, it was very scary.  Her last drink was Sunday evening, just a sip or two.  No blood.  She did have mild dry heaves and one episode of very mild emesis.  ED Course: h/o ETOH pancreatitis, presenting with the same.  Drinks 2 glasses of gin nightly.  Diffuse abdominal pain.  Creatinine normal 8ya, now 6.  Nephrology and GI consulted. CT abdomen performed in the ED demonstrated cholelithiasis without cholecystitis. No evidence of urinary tract calculi or obstructive uropathy or aortic atherosclerosis. Pancrease was unremarkable.  [facility] were consulted to admit the patient for further evaluation and treatment. She has been admitted to a telemetry bed on a CIWA protocol. GI has been consulted.  Consultants  . Gastroenterology  Procedures  . None  Antibiotics   Anti-infectives (From admission, onward)   None    .  Subjective  The patient is resting comfortably. She states that she is still hurting, but is feeling a little better.  Objective   Vitals:  Vitals:   07/20/20 1115 07/20/20 1147  BP: (!) 159/77 (!) 151/77  Pulse: 71 77  Resp: 19 17  Temp:    SpO2: 100% 100%    Exam:  Constitutional:  . The patient is awake, alert, and oriented x 3. No acute distress. Respiratory:  . No increased work of breathing. . No wheezes, rales, or rhonchi . No tactile fremitus Cardiovascular:  . Regular rate and rhythm . No murmurs, ectopy, or gallups. . No lateral PMI. No thrills. Abdomen:  . Abdomen is soft,  non-distended . Abdomen is diffusely tender with increased tenderness in epigastric . No hernias, masses, or organomegaly . Normoactive bowel sounds.  Musculoskeletal:  . No cyanosis, clubbing, or edema Skin:  . No rashes, lesions, ulcers . palpation of skin: no induration or nodules Neurologic:  . CN 2-12 intact . Sensation all 4 extremities intact Psychiatric:  . Mental status o Mood, affect appropriate o Orientation to person, place, time  . judgment and insight appear intact  I have personally reviewed the following:   Today's Data  Vitals, CMP, CBC, HbA1c, TSH  Imaging  . CT abdomen and pelvis.  Cardiology Data  . EKG  Scheduled Meds: . folic acid  1 mg Oral Daily  . heparin  5,000 Units Subcutaneous Q8H  . insulin aspart  0-9 Units Subcutaneous TID WC  . LORazepam  0-4 mg Intravenous Q6H   Followed by  . [START ON 07/21/2020] LORazepam  0-4 mg Intravenous Q12H  . metoprolol tartrate  12.5 mg Oral BID  . multivitamin with minerals  1 tablet Oral Daily  . sodium chloride flush  3 mL Intravenous Q12H  . thiamine  100 mg Oral Daily   Or  . thiamine  100 mg Intravenous Daily   Continuous Infusions: . sodium chloride 150 mL/hr at 07/20/20 0729  .  sodium bicarbonate (isotonic) infusion in sterile water 125 mL/hr at 07/19/20 1713    Principal Problem:   Acute alcoholic pancreatitis Active Problems:   Alcohol dependence with uncomplicated  withdrawal (HCC)   Tobacco dependence   HTN (hypertension)   Hyperlipidemia   Acute renal failure (ARF) (HCC)   LOS: 1 day   A & P  Acute alcoholic pancreatitis: Patient with prior h/o acute idiopathic pancreatitis in 2013 presenting with weakness and abdominal pain. Condition is suggestive of pancreatitis by H&P,elevated lipase, but CT does not show evidence of pancreatic inflammation; since she has 2 features positive, this is still likely acute alcoholic pancreatitis. Cholelithiasis was noted on CT. She may require  MRCP. I appreciate GI's assistance. She is receiving a clear liquid diet. She is receiving pain control and antiemetics.   Sepsis with hypotension, high respiratory rate with leukocytosis, AKI, and pancreatitis upon admission. The patient has received 2L bolus in NS. Appears resolved.   Acute renal failure: Normal/low creatinine in 2013; she has little muscle mass and so low creatinine would be generally expected. Creatinine upon admission was 6.34. Likely due to prerenal failure secondary to dehydration in the setting of alcohol use and pancreatitis, and continuation of ARB, Metformin. These and other nephrotoxic medications are held. Renal ultrasound is unremarkable. Nephrology has been consulted. Records requested from [place] to ascertain most recent baseline creatinine. Creatinine, electrolytes, and volume status will be monitored. Avoid nephrotoxins and hypotension.  ETOH dependence: Patient with chronic ETOH dependence. Number of drinks per day: 16 oz of liquor. She has been admitted in the past for ETOH withdrawal, but has no history of withdrawal seizures, ICU admissions, and DT's. Folate, thiamine, and MVI have been supplemented. TOC team consult for possible inpatient treatment. UDS was negative. Will consider offering a medication for Alcohol Use Disorder at the time of d/c, to include Disulfuram; Naltrexone; or Acamprosate.  DMII: HbA1c 5.3. Glucoses will be followed with FSBS and SSI.  HTN: Blood pressures are normotensive although home doses of Cozaar, lopressor, Carapres, and toprol have been held currently.  HLD: Hold Zocor for now given low likelihood of causing pancreatitis  Tobacco dependence: Encourage cessation. This was discussed with the patient and should be reviewed on an ongoing basis. Patch declined by patient.  I have seen and examined this patient myself. I have spent 35 minutes in her evaluation and care.  DVT prophylaxis:  Heparin Code Status:  DNR  Family  Communication: None present; I spoke with the patient's husband by telephone at the time of admission. Disposition Plan:The patient is from: home             Anticipated d/c is to: home without [facility] services              Anticipated d/c date will depend on clinical response to treatment, but likely 2-3 days             Patient is currently: acutely ill  Samin Milke, DO [facility] Direct contact: see [email or web address]  7PM-7AM contact night coverage as above 07/20/2020, 12:17 PM  LOS: 1 day

## 2020-07-21 ENCOUNTER — Other Ambulatory Visit: Payer: Self-pay

## 2020-07-21 LAB — T3, FREE: T3, Free: 2.1 pg/mL (ref 2.0–4.4)

## 2020-07-21 LAB — RENAL FUNCTION PANEL
Albumin: 2.8 g/dL — ABNORMAL LOW (ref 3.5–5.0)
Anion gap: 14 (ref 5–15)
BUN: 31 mg/dL — ABNORMAL HIGH (ref 6–20)
CO2: 29 mmol/L (ref 22–32)
Calcium: 7.4 mg/dL — ABNORMAL LOW (ref 8.9–10.3)
Chloride: 99 mmol/L (ref 98–111)
Creatinine, Ser: 3.84 mg/dL — ABNORMAL HIGH (ref 0.44–1.00)
GFR, Estimated: 13 mL/min — ABNORMAL LOW (ref >60)
Glucose, Bld: 84 mg/dL (ref 70–99)
Phosphorus: 2.9 mg/dL (ref 2.5–4.6)
Potassium: 3.4 mmol/L — ABNORMAL LOW (ref 3.5–5.1)
Sodium: 142 mmol/L (ref 135–145)

## 2020-07-21 LAB — CBC
HCT: 35.5 % — ABNORMAL LOW (ref 36.0–46.0)
Hemoglobin: 11.7 g/dL — ABNORMAL LOW (ref 12.0–15.0)
MCH: 30 pg (ref 26.0–34.0)
MCHC: 33 g/dL (ref 30.0–36.0)
MCV: 91 fL (ref 80.0–100.0)
Platelets: 326 10*3/uL (ref 150–400)
RBC: 3.9 MIL/uL (ref 3.87–5.11)
RDW: 13.4 % (ref 11.5–15.5)
WBC: 12.2 10*3/uL — ABNORMAL HIGH (ref 4.0–10.5)
nRBC: 0 % (ref 0.0–0.2)

## 2020-07-21 LAB — GLUCOSE, CAPILLARY
Glucose-Capillary: 122 mg/dL — ABNORMAL HIGH (ref 70–99)
Glucose-Capillary: 114 mg/dL — ABNORMAL HIGH (ref 70–99)
Glucose-Capillary: 60 mg/dL — ABNORMAL LOW (ref 70–99)
Glucose-Capillary: 110 mg/dL — ABNORMAL HIGH (ref 70–99)
Glucose-Capillary: 118 mg/dL — ABNORMAL HIGH (ref 70–99)

## 2020-07-21 MED ORDER — INFLUENZA VAC SPLIT QUAD 0.5 ML IM SUSY: 0.5000 mL | PREFILLED_SYRINGE | INTRAMUSCULAR | Status: DC

## 2020-07-21 NOTE — Progress Notes (Signed)
[facility] NEPHROLOGY PROGRESS NOTE  Assessment/ Plan: 59 year old female with history of DM, HTN, HLD, alcohol abuse with pancreatitis presented with generalized weakness, abdominal pain, nausea vomiting seen as a consultation for the evaluation of acute kidney injury. She takes losartan 100 mg for hypertension.  #Acute kidney injury likely hemodynamically mediated in the setting of severe dehydration concomitant with the use of losartan. The baseline kidney function unknown as she follows at [place]. It would be helpful if we can obtain lab results from her PCP. UA has chronic proteinuria, no hematuria.  Kidney ultrasound unremarkable. BUN and creatinine level continue to improve.  I expect complete renal recovery with IV hydration.  Discontinue sodium bicarbonate however continue NS.  Starting soft diet today.  Recommend to hold losartan until he sees his PCP as outpatient.  #Metabolic acidosis due to GI loss and AKI: Improved, discontinue IV sodium bicarbonate  #Hyperkalemia: Improved  #Hyponatremia, hypovolemic: Improved with IV fluid  #Acute on chronic pancreatitis due to alcohol use: On IV fluid and supportive care.  GI saw the patient.  #Hypertension: She was hypotensive in ER. BP improved with IV fluid.  Resume metoprolol, hold losartan.  #Alcohol abuse: Watching for alcohol withdrawal.  Per primary team.  Improving renal function.  Continue IVF and oral intake.  Expect complete renal recovery.  Nothing further to add.  I will sign off, please call back with question.  Subjective: Seen and examined at bedside.  Denies nausea vomiting chest pain shortness of breath.  Feels hungry and wanted to eat. Objective Vital signs in last 24 hours: Vitals:   07/20/20 2353 07/21/20 0400 07/21/20 0428 07/21/20 0811  BP: (!) 143/68  (!) 142/76 (!) 149/66  Pulse: 67 63 65 61  Resp: 18  17 20   Temp: 98.6 F (37 C)  98 F (36.7 C) 97.8 F (36.6 C)  TempSrc: Axillary   Oral Oral  SpO2: 97%  93% 96%   Weight change:   Intake/Output Summary (Last 24 hours) at 07/21/2020 1244 Last data filed at 07/21/2020 13/08/2020 Gross per 24 hour  Intake 1094.94 ml  Output 400 ml  Net 694.94 ml       Labs: Basic Metabolic Panel: Recent Labs  Lab 07/19/20 1211 07/20/20 0307 07/21/20 0055  NA 132* 138 142  K 5.5* 4.7 3.4*  CL 104 109 99  CO2 12* 16* 29  GLUCOSE 173* 108* 84  BUN 52* 43* 31*  CREATININE 6.34* 5.38* 3.84*  CALCIUM 9.2 7.8* 7.4*  PHOS  --  5.0* 2.9   Liver Function Tests: Recent Labs  Lab 07/19/20 1211 07/20/20 0307 07/21/20 0055  AST 14* 11*  --   ALT 9 7  --   ALKPHOS 41 33*  --   BILITOT 1.2 1.2  --   PROT 6.4* 5.1*  --   ALBUMIN 3.5 2.8*  2.8* 2.8*   Recent Labs  Lab 07/19/20 1211  LIPASE 921*   No results for input(s): AMMONIA in the last 168 hours. CBC: Recent Labs  Lab 07/19/20 1211 07/20/20 0307  WBC 12.0* 11.0*  HGB 12.0 10.1*  HCT 38.0 31.2*  MCV 96.9 94.5  PLT 375 313   Cardiac Enzymes: No results for input(s): CKTOTAL, CKMB, CKMBINDEX, TROPONINI in the last 168 hours. CBG: Recent Labs  Lab 07/20/20 1601 07/20/20 2025 07/21/20 0806 07/21/20 1147 07/21/20 1210  GLUCAP 96 119* 122* 60* 114*    Iron Studies: No results for input(s): IRON, TIBC, TRANSFERRIN, FERRITIN in the last 72 hours.  Studies/Results: CT ABDOMEN PELVIS WO CONTRAST  Result Date: 07/19/2020 CLINICAL DATA:  Abdominal pain, acute renal insufficiency EXAM: CT ABDOMEN AND PELVIS WITHOUT CONTRAST TECHNIQUE: Multidetector CT imaging of the abdomen and pelvis was performed following the standard protocol without IV contrast. COMPARISON:  07/19/2020, 03/14/2012 FINDINGS: Lower chest: No acute pleural or parenchymal lung disease. Hepatobiliary: Calcified gallstone without cholecystitis. Liver is unremarkable. Pancreas: Unremarkable. No pancreatic ductal dilatation or surrounding inflammatory changes. Spleen: Normal in size without focal  abnormality. Adrenals/Urinary Tract: Adrenal glands are unremarkable. Kidneys are normal, without renal calculi, focal lesion, or hydronephrosis. Bladder is unremarkable. Stomach/Bowel: No bowel obstruction or ileus. No bowel wall thickening or inflammatory change. The appendix is surgically absent. Vascular/Lymphatic: Aortic atherosclerosis. No enlarged abdominal or pelvic lymph nodes. Reproductive: Uterus and bilateral adnexa are unremarkable. Other: No free fluid or free gas.  No abdominal wall hernia. Musculoskeletal: No acute or destructive bony lesions. Reconstructed images demonstrate no additional findings. IMPRESSION: 1. Cholelithiasis without cholecystitis. 2. No evidence of urinary tract calculi or obstructive uropathy. 3.  Aortic Atherosclerosis (ICD10-I70.0). Electronically Signed   By: Sharlet Salina M.D.   On: 07/19/2020 16:00   US Renal  Result Date: 07/19/2020 CLINICAL DATA:  Acute renal injury EXAM: RENAL / URINARY TRACT ULTRASOUND COMPLETE COMPARISON:  None. FINDINGS: Right Kidney: Renal measurements: 11.4 x 4.9 x 4.7 cm. = volume: 137 mL. Echogenicity within normal limits. No mass or hydronephrosis visualized. Left Kidney: Renal measurements: 11.1 x 5.5 x 6.3 cm. = volume: 198 mL. Echogenicity within normal limits. No mass or hydronephrosis visualized. Bladder: Appears normal for degree of bladder distention. Other: None. IMPRESSION: No acute abnormality of the kidneys is noted. Electronically Signed   By: Alcide Clever M.D.   On: 07/19/2020 14:49    Medications: Infusions: . sodium chloride 100 mL/hr at 07/21/20 0910    Scheduled Medications: . folic acid  1 mg Oral Daily  . heparin  5,000 Units Subcutaneous Q8H  . insulin aspart  0-9 Units Subcutaneous TID WC  . LORazepam  0-4 mg Intravenous Q6H   Followed by  . LORazepam  0-4 mg Intravenous Q12H  . metoprolol tartrate  12.5 mg Oral BID  . multivitamin with minerals  1 tablet Oral Daily  . sodium chloride flush  3 mL  Intravenous Q12H  . thiamine  100 mg Oral Daily   Or  . thiamine  100 mg Intravenous Daily    have reviewed scheduled and prn medications.  Physical Exam: General:NAD, comfortable Heart:RRR, s1s2 nl Lungs:clear b/l, no crackle Abdomen:soft, Non-tender, non-distended Extremities:No edema Neurology: Alert awake and following commands.  Zaeda Mcferran Jaynie Collins 07/21/2020,12:44 PM  LOS: 2 days  Pager: [phone]

## 2020-07-21 NOTE — Progress Notes (Signed)
[facility] Gastroenterology Progress Note  Lisa Blanchard 59 y.o. [birth year]  CC:  Acute pancreatitis   Subjective: Patient reports feeling much better today.  Denies any abdominal pain.  She is tolerating a clear liquid diet and requests advancement of her diet.  Also notes her sister's funeral is on Tuesday 11/16 and hopes to be discharged by then.  ROS : Review of Systems  Cardiovascular: Negative for chest pain and palpitations.  Gastrointestinal: Negative for abdominal pain, blood in stool, constipation, diarrhea, heartburn, melena, nausea and vomiting.   Objective: Vital signs in last 24 hours: Vitals:   07/21/20 0428 07/21/20 0811  BP: (!) 142/76 (!) 149/66  Pulse: 65 61  Resp: 17 20  Temp: 98 F (36.7 C) 97.8 F (36.6 C)  SpO2: 93% 96%    Physical Exam:  General:  Alert, cooperative, no distress, appears stated age  Head:  Normocephalic, without obvious abnormality, atraumatic  Eyes:  Anicteric sclera, EOMs intact  Lungs:   Clear to auscultation bilaterally, respirations unlabored  Heart:  Regular rate and rhythm, S1, S2 normal  Abdomen:   Soft, mild epigastric tenderness, +bowel sounds, no guarding or peritoneal signs  Extremities: Extremities normal, atraumatic, no  edema  Pulses: 2+ and symmetric    Lab Results: Recent Labs    07/20/20 0307 07/21/20 0055  NA 138 142  K 4.7 3.4*  CL 109 99  CO2 16* 29  GLUCOSE 108* 84  BUN 43* 31*  CREATININE 5.38* 3.84*  CALCIUM 7.8* 7.4*  PHOS 5.0* 2.9   Recent Labs    07/19/20 1211 07/19/20 1211 07/20/20 0307 07/21/20 0055  AST 14*  --  11*  --   ALT 9  --  7  --   ALKPHOS 41  --  33*  --   BILITOT 1.2  --  1.2  --   PROT 6.4*  --  5.1*  --   ALBUMIN 3.5   < > 2.8*  2.8* 2.8*   < > = values in this interval not displayed.   Recent Labs    07/19/20 1211 07/20/20 0307  WBC 12.0* 11.0*  HGB 12.0 10.1*  HCT 38.0 31.2*  MCV 96.9 94.5  PLT 375 313   No results for input(s): LABPROT, INR in the last 72  hours.   Assessment: Acute pancreatitis: Abdominal pain and lipase elevated to 921 (07/19/20).  Presumably due to alcohol use.  Normal LFTs. -WBCs 11.0 yesterday; today's CBC not yet completed -Renal function continues to improve.  BUN 31/Cr 3.84 today as compared to BUN 43/Cr 5.38 yesterday -CT 11/10 did not show any signs of pancreatitis inflammation, though CT was non-contrasted due to renal function  Plan: Continue IV fluids.  Continue supportive care.  Soft diet OK.  [facility] will follow.  Edrick Kins PA-C 07/21/2020, 10:13 AM  Contact #  [phone]

## 2020-07-21 NOTE — Consult Note (Addendum)
Nephrology Follow-Up Consult note   Assessment/Recommendations: Lisa Blanchard is a/an 59 y.o. female with a past medical history significant for HTN, HLD, DM, EtOH use disorder, pancreatitis, tobacco use.    AKI: Creatinine improving with fluid resuscitation: Cr/BUN continue to improve, down to 3.84/31 from 5.38/43 yesterday. Can decrease IVF to encourage PO fluid intake. Bicarb normalized to 29, can stop sodium bicarb administration. UOP not charted in ED, now that patient is on floor, strict I/Os being monitored. 400cc UOP charted so far, expect this is likely underreported. Expect patient to continue to improve. Continue to monitor. -Decrease NS 100 mL/hr -Discontinue sodium bicarb -Monitor Daily I/Os, Daily weight  -Maintain MAP>65 for optimal renal perfusion.  -Avoid nephrotoxic medications including NSAIDs and Vanc/Zosyn combo -Currently no indication for HD -Daily BMP, phosphorus, mg  Hypokalemia: mild to 3.4, expect this to improve as patient begins to consume more food. Can replete with KCl 20 mEq if desired. - Daily BMP  Pancreatitis: Patient has h/o EtOH pancreatitis in 2013. Lipase elevated to 921 at admission. Recommend continued rehydration, NPO, pain management. CT a/p w/o contrast did was non-diagnostic of pancreatitis, but clinical picture, history, and elevated lipase still make this most likely diagnosis.  - per primary team - GI consulted  Anemia: Patient was not anemic at admission, hgb 12.8. However, yesterday dropped to 10.1, suspect this is due to hemoconcentration and true hgb more likely in 10-11 range. CBC not obtained today, recommend checking daily. MCV WNL, unknown cr baseline, possible iron deficiency vs etoh use (less likely w/o macrocytic anemia) vs AoCD. Recommend continued monitoring. -Daily CBC -Transfuse for hgb <7 g/dL  Hypertension: Patient has h/o hypertension, but was hypotensive on admission. Patient is on Losartan 100 mg at home, all home meds  held in setting of AKI. With fluid resuscitation, BP is now mildly hypertensive, probably more accurate of her baseline. SBP ranges from 130s-150s. Most recently 149/66, acceptable. Continue Lopressor 12.5mg  BID. - Continue to monitor  Diabetes Mellitus Type 2: patient has diabetes, A1c 5.3%. BG ranged from 84-96 yesterday. Home metfomin held. Recommend follow up with PCP regarding medications.  -Continue to monitor  EtOH use disorder: Patient has a history of EtOH use disorder since at least 2013.  Patient is on CIWA protocol per primary team.  Recommend substance use support and education.  -per primary team -Recommend transitions of care for substance use d/o resources  Recommendations conveyed to primary service.   Shirlean Mylar, MD PGY-2 07/21/2020 8:38 AM  ___________________________________________________________  CC: AKI  Interval History/Subjective: Patient continues to feel much better, no complaints, diet advanced.   Medications:  Current Facility-Administered Medications  Medication Dose Route Frequency Provider Last Rate Last Admin  . 0.9 %  sodium chloride infusion   Intravenous Continuous Jonah Blue, MD 150 mL/hr at 07/21/20 0418 New Bag at 07/21/20 0418  . acetaminophen (TYLENOL) tablet 650 mg  650 mg Oral Q6H PRN Jonah Blue, MD       Or  . acetaminophen (TYLENOL) suppository 650 mg  650 mg Rectal Q6H PRN Jonah Blue, MD      . calcium carbonate (dosed in mg elemental calcium) suspension 500 mg of elemental calcium  500 mg of elemental calcium Oral Q6H PRN Jonah Blue, MD      . camphor-menthol [facility]) lotion 1 application  1 application Topical Q8H PRN Jonah Blue, MD       And  . hydrOXYzine (ATARAX/VISTARIL) tablet 25 mg  25 mg Oral Q8H PRN Jonah Blue, MD      .  docusate sodium (ENEMEEZ) enema 283 mg  1 enema Rectal PRN Jonah Blue, MD      . feeding supplement (NEPRO CARB STEADY) liquid 237 mL  237 mL Oral TID PRN Jonah Blue, MD      . folic acid (FOLVITE) tablet 1 mg  1 mg Oral Daily Jonah Blue, MD   1 mg at 07/21/20 0827  . heparin injection 5,000 Units  5,000 Units Subcutaneous Bobette Mo, MD   5,000 Units at 07/21/20 0524  . hydrALAZINE (APRESOLINE) injection 5 mg  5 mg Intravenous Q4H PRN Jonah Blue, MD      . insulin aspart (novoLOG) injection 0-9 Units  0-9 Units Subcutaneous TID WC Jonah Blue, MD   1 Units at 07/21/20 0827  . LORazepam (ATIVAN) injection 0-4 mg  0-4 mg Intravenous Q6H Jonah Blue, MD       Followed by  . LORazepam (ATIVAN) injection 0-4 mg  0-4 mg Intravenous Steva Colder, MD      . LORazepam (ATIVAN) tablet 1-4 mg  1-4 mg Oral Q1H PRN Jonah Blue, MD       Or  . LORazepam (ATIVAN) injection 1-4 mg  1-4 mg Intravenous Q1H PRN Jonah Blue, MD      . metoprolol tartrate (LOPRESSOR) tablet 12.5 mg  12.5 mg Oral BID Maxie Barb, MD   12.5 mg at 07/21/20 0825  . morphine 2 MG/ML injection 2 mg  2 mg Intravenous Q2H PRN Jonah Blue, MD      . multivitamin with minerals tablet 1 tablet  1 tablet Oral Daily Jonah Blue, MD   1 tablet at 07/21/20 0827  . ondansetron (ZOFRAN) tablet 4 mg  4 mg Oral Q6H PRN Jonah Blue, MD       Or  . ondansetron [facility]) injection 4 mg  4 mg Intravenous Q6H PRN Jonah Blue, MD      . polyvinyl alcohol (LIQUIFILM TEARS) 1.4 % ophthalmic solution 1 drop  1 drop Both Eyes QID PRN Jonah Blue, MD      . sodium chloride flush (NS) 0.9 % injection 3 mL  3 mL Intravenous Q12H Jonah Blue, MD   3 mL at 07/20/20 2024  . sorbitol 70 % solution 30 mL  30 mL Oral PRN Jonah Blue, MD      . thiamine tablet 100 mg  100 mg Oral Daily Jonah Blue, MD   100 mg at 07/21/20 0827   Or  . thiamine (B-1) injection 100 mg  100 mg Intravenous Daily Jonah Blue, MD      . zolpidem Remus Loffler) tablet 5 mg  5 mg Oral QHS PRN Jonah Blue, MD          Review of Systems: 10 systems reviewed  and negative except per interval history/subjective  Physical Exam: Vitals:   07/21/20 0428 07/21/20 0811  BP: (!) 142/76 (!) 149/66  Pulse: 65 61  Resp: 17 20  Temp: 98 F (36.7 C) 97.8 F (36.6 C)  SpO2: 93% 96%   Total I/O In: 240 [P.O.:240] Out: 400 [Urine:400]  Intake/Output Summary (Last 24 hours) at 07/21/2020 7619 Last data filed at 07/21/2020 5093 Gross per 24 hour  Intake 1094.94 ml  Output 400 ml  Net 694.94 ml   Constitutional: well-appearing, no acute distress ENMT: ears and nose without scars or lesions, MMM CV: normal rate, no edema Respiratory: clear to auscultation, normal work of breathing Gastrointestinal: soft, non-tender, no palpable masses or hernias Skin: no visible lesions or  rashes Psych: alert, judgement/insight appropriate, appropriate mood and affect   Test Results I personally reviewed new and old clinical labs and radiology tests Lab Results  Component Value Date   NA 142 07/21/2020   K 3.4 (L) 07/21/2020   CL 99 07/21/2020   CO2 29 07/21/2020   BUN 31 (H) 07/21/2020   CREATININE 3.84 (H) 07/21/2020   CALCIUM 7.4 (L) 07/21/2020   ALBUMIN 2.8 (L) 07/21/2020   PHOS 2.9 07/21/2020

## 2020-07-21 NOTE — TOC Initial Note (Signed)
Transition of Care [facility]) - Initial/Assessment Note    Patient Details  Name: Lisa Blanchard MRN: [id number] Date of Birth: 23-Nov-1960  [facility] CM/SW Contact:    Lockie Pares, RN Phone Number: 07/21/2020, 9:44 AM  Clinical Narrative:                 Admitted with pancreatitis  ETOH, [facility] Has a long history of ETOH abuse.Last drink Sunday evening, she is on CIWA protocol.  She is in acute renal failure. Rehydrated. DNR status. CM will follow for needs, and work with the [place] on any DME or Home Health. If the patient requires home oxygen, the [facility] needs 48 hours notice.   Expected Discharge Plan: Home/Self Care Barriers to Discharge: Continued Medical Work up   Patient Goals and CMS Choice        Expected Discharge Plan and Services Expected Discharge Plan: Home/Self Care   Discharge Planning Services: CM Consult   Living arrangements for the past 2 months: Single Family Home                                      Prior Living Arrangements/Services Living arrangements for the past 2 months: Single Family Home Lives with:: Significant Other Patient language and need for interpreter reviewed:: Yes        Need for Family Participation in Patient Care: Yes (Comment) Care giver support system in place?: Yes (comment)   Criminal Activity/Legal Involvement Pertinent to Current Situation/Hospitalization: No - Comment as needed  Activities of Daily Living Home Assistive Devices/Equipment: Blood pressure cuff, CBG Meter ADL Screening (condition at time of admission) Patient's cognitive ability adequate to safely complete daily activities?: Yes Is the patient deaf or have difficulty hearing?: No Does the patient have difficulty seeing, even when wearing glasses/contacts?: No Does the patient have difficulty concentrating, remembering, or making decisions?: No Patient able to express need for assistance with ADLs?: Yes Does the patient  have difficulty dressing or bathing?: No Independently performs ADLs?: Yes (appropriate for developmental age) Does the patient have difficulty walking or climbing stairs?: No Weakness of Legs: None Weakness of Arms/Hands: None  Permission Sought/Granted                  Emotional Assessment Appearance:: Appears older than stated age       Alcohol / Substance Use: Alcohol Use Psych Involvement: No (comment)  Admission diagnosis:  Acute alcoholic pancreatitis [K85.20] AKI (acute kidney injury) (HCC) [N17.9] Alcohol-induced acute pancreatitis, unspecified complication status [K85.20] Patient Active Problem List   Diagnosis Date Noted  . Acute renal failure (ARF) (HCC) 07/19/2020  . Hyperlipidemia 03/15/2012  . Acute alcoholic pancreatitis 03/14/2012  . Alcohol dependence with uncomplicated withdrawal (HCC) 03/14/2012  . Tobacco dependence 03/14/2012  . HTN (hypertension) 03/14/2012  . Hypokalemia 03/14/2012  . Abdominal pain, epigastric 03/14/2012   PCP:  Default, Provider, MD Pharmacy:   [facility] [phone] [place], [place] - [address] [place] [address] Phone: [phone] Fax: [phone]     Social Determinants of Health (SDOH) Interventions    Readmission Risk Interventions No flowsheet data found.

## 2020-07-21 NOTE — Progress Notes (Signed)
Hypoglycemic Event  CBG: 60  Treatment: 8 oz juice/soda  Symptoms:none  Follow-up CBG: Time:1210 CBG Result:114  Possible Reasons for Event: Unknown     Eligah East

## 2020-07-21 NOTE — Progress Notes (Signed)
PROGRESS NOTE  Lisa Blanchard OFH:[id number] DOB: 05/31/1961 DOA: 07/19/2020 PCP: Default, Provider, MD  Brief History   Lisa Blanchard is a 59 y.o. female with medical history significant of HTN and ETOH dependence presenting with weakness and abdominal pain.  She reports that she "felt like crap" starting Sunday evening.  The pain started Monday evening in her left upper quadrant and upper abdomen.  No n/v.  No diarrhea.  She has never felt like this before, it was very scary.  Her last drink was Sunday evening, just a sip or two.  No blood.  She did have mild dry heaves and one episode of very mild emesis.  ED Course: h/o ETOH pancreatitis, presenting with the same.  Drinks 2 glasses of gin nightly.  Diffuse abdominal pain.  Creatinine normal 8ya, now 6.  Nephrology and GI consulted. CT abdomen performed in the ED demonstrated cholelithiasis without cholecystitis. No evidence of urinary tract calculi or obstructive uropathy or aortic atherosclerosis. Pancrease was unremarkable.  [facility] were consulted to admit the patient for further evaluation and treatment. She has been admitted to a telemetry bed on a CIWA protocol. GI has been consulted.  Diet has been advanced to full liquid this morning.  Consultants  . Gastroenterology  Procedures  . None  Antibiotics   Anti-infectives (From admission, onward)   None     Subjective  The patient is resting comfortably. She states that she is having less pain, and has an appetite for advancing her diet.  Objective   Vitals:  Vitals:   07/21/20 0428 07/21/20 0811  BP: (!) 142/76 (!) 149/66  Pulse: 65 61  Resp: 17 20  Temp: 98 F (36.7 C) 97.8 F (36.6 C)  SpO2: 93% 96%    Exam:  Constitutional:  . The patient is awake, alert, and oriented x 3. No acute distress. Respiratory:  . No increased work of breathing. . No wheezes, rales, or rhonchi . No tactile fremitus Cardiovascular:  . Regular rate and rhythm . No  murmurs, ectopy, or gallups. . No lateral PMI. No thrills. Abdomen:  . Abdomen is soft, non-distended . Abdomen is mostly non-tender with mild tenderness in epigastric region . No hernias, masses, or organomegaly . Normoactive bowel sounds.  Musculoskeletal:  . No cyanosis, clubbing, or edema Skin:  . No rashes, lesions, ulcers . palpation of skin: no induration or nodules Neurologic:  . CN 2-12 intact . Sensation all 4 extremities intact Psychiatric:  . Mental status o Mood, affect appropriate o Orientation to person, place, time  . judgment and insight appear intact  I have personally reviewed the following:   Today's Data  Vitals, CMP, CBC, HbA1c, TSH  Imaging  . CT abdomen and pelvis.  Cardiology Data  . EKG  Scheduled Meds: . folic acid  1 mg Oral Daily  . heparin  5,000 Units Subcutaneous Q8H  . insulin aspart  0-9 Units Subcutaneous TID WC  . LORazepam  0-4 mg Intravenous Q6H   Followed by  . LORazepam  0-4 mg Intravenous Q12H  . metoprolol tartrate  12.5 mg Oral BID  . multivitamin with minerals  1 tablet Oral Daily  . sodium chloride flush  3 mL Intravenous Q12H  . thiamine  100 mg Oral Daily   Or  . thiamine  100 mg Intravenous Daily   Continuous Infusions: . sodium chloride 125 mL/hr at 07/21/20 1249    Principal Problem:   Acute alcoholic pancreatitis Active Problems:   Alcohol dependence with  uncomplicated withdrawal (HCC)   Tobacco dependence   HTN (hypertension)   Hyperlipidemia   Acute renal failure (ARF) (HCC)   LOS: 2 days   A & P  Acute alcoholic pancreatitis: Patient with prior h/o acute idiopathic pancreatitis in 2013 presenting with weakness and abdominal pain. Condition is suggestive of pancreatitis by H&P,elevated lipase, but CT does not show evidence of pancreatic inflammation; since she has 2 features positive, this is still likely acute alcoholic pancreatitis. Cholelithiasis was noted on CT. She is tolerating her clear liquid  diet well. She has been advanced to a full liquid diet. She is receiving pain control and antiemetics.  I appreciate GI's assistance.   Sepsis with hypotension, high respiratory rate with leukocytosis, AKI, and pancreatitis upon admission. The patient has received 2L bolus in NS. Appears resolved.   Acute renal failure: Normal/low creatinine in 2013; she has little muscle mass and so low creatinine would be generally expected. Creatinine upon admission was 6.34. Likely due to prerenal failure secondary to dehydration in the setting of alcohol use and pancreatitis, and continuation of ARB, Metformin. These and other nephrotoxic medications are held. Renal ultrasound is unremarkable.  Records requested from [place] to ascertain most recent baseline creatinine. Creatinine, electrolytes, and volume status will be monitored. Avoid nephrotoxins and hypotension. Creatinine is down to 3.84 this morning. Sodium bicarbonate has been discontinued, but NS has been continued.  Nephrology has been consulted. I appreciate their assistance.  ETOH dependence: Patient with chronic ETOH dependence. Number of drinks per day: 16 oz of liquor. She has been admitted in the past for ETOH withdrawal, but has no history of withdrawal seizures, ICU admissions, and DT's. Folate, thiamine, and MVI have been supplemented. TOC team consult for possible inpatient treatment. UDS was negative. Will consider offering a medication for Alcohol Use Disorder at the time of d/c, to include Disulfuram; Naltrexone; or Acamprosate.  DMII: HbA1c 5.3. Glucoses will be followed with FSBS and SSI.  HTN: Blood pressures are normotensive although home doses of Cozaar, lopressor, Carapres, and toprol have been held currently.  HLD: Hold Zocor for now given low likelihood of causing pancreatitis  Tobacco dependence: Encourage cessation. This was discussed with the patient and should be reviewed on an ongoing basis. Patch declined by patient.  I have  seen and examined this patient myself. I have spent 32 minutes in her evaluation and care.  DVT prophylaxis:  Heparin Code Status:  DNR  Family Communication: None present; I spoke with the patient's husband by telephone at the time of admission. Disposition Plan:The patient is from: home             Anticipated d/c is to: home without [facility] services              Anticipated d/c date will depend on clinical response to treatment, but likely 2-3 days             Patient is currently: acutely ill  Lisa Restivo, DO [facility] Direct contact: see [email or web address]  7PM-7AM contact night coverage as above 07/21/2020, 3:49 PM  LOS: 1 day

## 2020-07-22 ENCOUNTER — Other Ambulatory Visit: Payer: Self-pay

## 2020-07-22 LAB — CBC
HCT: 29 % — ABNORMAL LOW (ref 36.0–46.0)
Hemoglobin: 9.7 g/dL — ABNORMAL LOW (ref 12.0–15.0)
MCH: 30.5 pg (ref 26.0–34.0)
MCHC: 33.4 g/dL (ref 30.0–36.0)
MCV: 91.2 fL (ref 80.0–100.0)
Platelets: 273 10*3/uL (ref 150–400)
RBC: 3.18 MIL/uL — ABNORMAL LOW (ref 3.87–5.11)
RDW: 13.2 % (ref 11.5–15.5)
WBC: 9.6 10*3/uL (ref 4.0–10.5)
nRBC: 0 % (ref 0.0–0.2)

## 2020-07-22 LAB — BASIC METABOLIC PANEL WITH GFR
Anion gap: 14 (ref 5–15)
BUN: 16 mg/dL (ref 6–20)
CO2: 26 mmol/L (ref 22–32)
Calcium: 6.9 mg/dL — ABNORMAL LOW (ref 8.9–10.3)
Chloride: 100 mmol/L (ref 98–111)
Creatinine, Ser: 2.17 mg/dL — ABNORMAL HIGH (ref 0.44–1.00)
GFR, Estimated: 26 mL/min — ABNORMAL LOW
Glucose, Bld: 168 mg/dL — ABNORMAL HIGH (ref 70–99)
Potassium: 3.2 mmol/L — ABNORMAL LOW (ref 3.5–5.1)
Sodium: 140 mmol/L (ref 135–145)

## 2020-07-22 LAB — RENAL FUNCTION PANEL
Albumin: 2.7 g/dL — ABNORMAL LOW (ref 3.5–5.0)
Anion gap: 9 (ref 5–15)
BUN: 21 mg/dL — ABNORMAL HIGH (ref 6–20)
CO2: 32 mmol/L (ref 22–32)
Calcium: 6.7 mg/dL — ABNORMAL LOW (ref 8.9–10.3)
Chloride: 100 mmol/L (ref 98–111)
Creatinine, Ser: 2.49 mg/dL — ABNORMAL HIGH (ref 0.44–1.00)
GFR, Estimated: 22 mL/min — ABNORMAL LOW (ref >60)
Glucose, Bld: 92 mg/dL (ref 70–99)
Phosphorus: 2.4 mg/dL — ABNORMAL LOW (ref 2.5–4.6)
Potassium: 2.8 mmol/L — ABNORMAL LOW (ref 3.5–5.1)
Sodium: 141 mmol/L (ref 135–145)

## 2020-07-22 LAB — GLUCOSE, CAPILLARY
Glucose-Capillary: 126 mg/dL — ABNORMAL HIGH (ref 70–99)
Glucose-Capillary: 66 mg/dL — ABNORMAL LOW (ref 70–99)
Glucose-Capillary: 156 mg/dL — ABNORMAL HIGH (ref 70–99)

## 2020-07-22 MED ORDER — POTASSIUM CHLORIDE CRYS ER 20 MEQ PO TBCR
40.0000 meq | EXTENDED_RELEASE_TABLET | ORAL | Status: AC
  Administered 2020-07-22 (×2): 40 meq via ORAL
  Filled 2020-07-22 (×2): qty 2

## 2020-07-22 MED ORDER — ADULT MULTIVITAMIN W/MINERALS CH: 1.0000 | ORAL_TABLET | Freq: Every day | ORAL | 0 refills | Status: AC

## 2020-07-22 MED ORDER — POTASSIUM CHLORIDE CRYS ER 20 MEQ PO TBCR: 40.0000 meq | EXTENDED_RELEASE_TABLET | Freq: Once | ORAL | Status: DC

## 2020-07-22 MED ORDER — POTASSIUM CHLORIDE CRYS ER 20 MEQ PO TBCR: 20.0000 meq | EXTENDED_RELEASE_TABLET | Freq: Every day | ORAL | 0 refills | Status: AC

## 2020-07-22 MED ORDER — THIAMINE HCL 100 MG PO TABS: 100.0000 mg | ORAL_TABLET | Freq: Every day | ORAL | 0 refills | Status: AC

## 2020-07-22 MED ORDER — NEPRO/CARBSTEADY PO LIQD: 237.0000 mL | Freq: Three times a day (TID) | ORAL | 0 refills | Status: AC | PRN

## 2020-07-22 NOTE — Progress Notes (Signed)
Patient's CBG this afternoon was 66. Patient alert and oriented, asymptomatic. At that time she was ready to ear her lunch. After lunch CBG came up to 156. MD notified. Will continue to monitor.

## 2020-07-22 NOTE — Discharge Summary (Signed)
Physician Discharge Summary  Lisa Sheehanleanor Helmkamp ZOX:[id number] DOB: Oct 14, 1960 DOA: 07/19/2020  PCP: Default, Provider, MD  Admit date: 07/19/2020 Discharge date: 07/22/2020  Recommendations for Outpatient Follow-up:  1. Discharge to home 2. Avoid alcohol 3. Follow up with PCP in 7-10 days. 4. Have chemistry drawn on that visit.   Follow-up Information    [facility] Schedule an appointment as soon as possible for a visit.   Specialty: General Practice Contact information: [address] [place] [id number] [phone]              Discharge Diagnoses: Principal diagnosis is #1 1. Alcoholic Pancreatitis 2. Alcohol abuse 3. Hypokalemia 4. AKI, Volume depletion 5. DM II 6. Hypertension 7. Metabolic acidosis 8. Tobacco dependence  Discharge Condition: Fair  Disposition: Home  Diet recommendation: Heart healthy  Filed Weights   07/22/20 0424  Weight: 51 kg    History of present illness: Lisa Blanchard is a 59 y.o. female with medical history significant of HTN and ETOH dependence presenting with weakness and abdominal pain.  She reports that she "felt like crap" starting Sunday evening.  The pain started Monday evening in her left upper quadrant and upper abdomen.  No n/v.  No diarrhea.  She has never felt like this before, it was very scary.  Her last drink was Sunday evening, just a sip or two.  No blood.  She did have mild dry heaves and one episode of very mild emesis.    ED Course: h/o ETOH pancreatitis, presenting with the same.  Drinks 2 glasses of gin nightly.  Diffuse abdominal pain.  Creatinine normal 8ya, now 6.  Nephrology and GI consulted.  Abdominal CT pending.  Hospital Course: Ernestene Kielleanor Lawsonis a 59 y.o.femalewith medical history significant ofHTN and ETOH dependence presenting with weakness and abdominal pain.She reports that she "felt like crap" starting Sunday evening. The pain started Monday evening in her left upper  quadrant and upper abdomen. No n/v. No diarrhea. She has never felt like this before, it was very scary. Her last drink was Sunday evening, just a sip or two. No blood. She did have mild dry heaves and one episode of very mild emesis.  ED Course:h/o ETOH pancreatitis, presenting with the same. Drinks 2 glasses of gin nightly. Diffuse abdominal pain. Creatinine normal 8ya, now 6. Nephrology and GI consulted. CT abdomen performed in the ED demonstrated cholelithiasis without cholecystitis. No evidence of urinary tract calculi or obstructive uropathy or aortic atherosclerosis. Pancrease was unremarkable.  [facility] were consulted to admit the patient for further evaluation and treatment. She has been admitted to a telemetry bed on a CIWA protocol. GI has been consulted.  Diet has been advanced to aregular this morning. She has tolerated it well. She will be discharged to home in fair condition.  Today's assessment: S: The patient is resting comfortably. No new complaints. O: Vitals:  Vitals:   07/22/20 0800 07/22/20 1100  BP:    Pulse: 62 (!) 57  Resp:    Temp:    SpO2:     Exam:  Constitutional:  . The patient is awake, alert, and oriented x 3. No acute distress. Eyes:  . pupils and irises appear normal . Normal lids and conjunctivae ENMT:  . grossly normal hearing  . Lips appear normal . external ears, nose appear normal . Oropharynx: mucosa, tongue,posterior pharynx appear normal Neck:  . neck appears normal, no masses, normal ROM, supple . no thyromegaly Respiratory:  . No increased work of breathing. .Marland Kitchen  No wheezes, rales, or rhonchi . No tactile fremitus Cardiovascular:  . Regular rate and rhythm . No murmurs, ectopy, or gallups. . No lateral PMI. No thrills. Abdomen:  . Abdomen is soft, non-tender, non-distended . No hernias, masses, or organomegaly . Normoactive bowel sounds.  Musculoskeletal:  . No cyanosis, clubbing, or edema Skin:  . No  rashes, lesions, ulcers . palpation of skin: no induration or nodules Neurologic:  . CN 2-12 intact . Sensation all 4 extremities intact Psychiatric:  . Mental status o Mood, affect appropriate o Orientation to person, place, time  . judgment and insight appear intact  Discharge Instructions  Discharge Instructions    Activity as tolerated - No restrictions   Complete by: As directed    Call MD for:  persistant nausea and vomiting   Complete by: As directed    Call MD for:  severe uncontrolled pain   Complete by: As directed    Diet - low sodium heart healthy   Complete by: As directed    Discharge instructions   Complete by: As directed    Discharge to home Avoid alcohol Follow up with PCP in 7-10 days. Have chemistry drawn on that visit.   Increase activity slowly   Complete by: As directed      Allergies as of 07/22/2020   No Known Allergies     Medication List    STOP taking these medications   cloNIDine 0.2 MG tablet Commonly known as: CATAPRES   Hypochlorous Acid 0.012 % Soln   metoprolol succinate 100 MG 24 hr tablet Commonly known as: Toprol XL     TAKE these medications   amLODipine 5 MG tablet Commonly known as: NORVASC Take 1 tablet (5 mg total) by mouth daily.   aspirin EC 81 MG tablet Take 81 mg by mouth daily. Swallow whole.   feeding supplement (NEPRO CARB STEADY) Liqd Take 237 mLs by mouth 3 (three) times daily as needed (Supplement).   folic acid 1 MG tablet Commonly known as: FOLVITE Take 1 mg by mouth daily.   losartan 100 MG tablet Commonly known as: COZAAR Take 100 mg by mouth daily.   metFORMIN 1000 MG tablet Commonly known as: GLUCOPHAGE Take 1,000 mg by mouth 2 (two) times daily.   metoprolol tartrate 25 MG tablet Commonly known as: LOPRESSOR Take 12.5 mg by mouth 2 (two) times daily.   multivitamin with minerals Tabs tablet Take 1 tablet by mouth daily. Start taking on: July 23, 2020   omeprazole 20 MG  capsule Commonly known as: PRILOSEC Take 1 capsule (20 mg total) by mouth daily.   potassium chloride SA 20 MEQ tablet Commonly known as: KLOR-CON Take 1 tablet (20 mEq total) by mouth daily. What changed: how much to take   simvastatin 40 MG tablet Commonly known as: ZOCOR Take 40 mg by mouth at bedtime. What changed: Another medication with the same name was removed. Continue taking this medication, and follow the directions you see here.   Systane 0.4-0.3 % Soln Generic drug: Polyethyl Glycol-Propyl Glycol Apply 1 drop to eye 4 (four) times daily as needed (dry eyes).   thiamine 100 MG tablet Take 1 tablet (100 mg total) by mouth daily. Start taking on: July 23, 2020 What changed: how much to take   Vitamin D 50 MCG (2000 UT) tablet Take 2,000 Units by mouth daily.      No Known Allergies  The results of significant diagnostics from this hospitalization (including imaging, microbiology, ancillary and  laboratory) are listed below for reference.    Significant Diagnostic Studies: CT ABDOMEN PELVIS WO CONTRAST  Result Date: 07/19/2020 CLINICAL DATA:  Abdominal pain, acute renal insufficiency EXAM: CT ABDOMEN AND PELVIS WITHOUT CONTRAST TECHNIQUE: Multidetector CT imaging of the abdomen and pelvis was performed following the standard protocol without IV contrast. COMPARISON:  07/19/2020, 03/14/2012 FINDINGS: Lower chest: No acute pleural or parenchymal lung disease. Hepatobiliary: Calcified gallstone without cholecystitis. Liver is unremarkable. Pancreas: Unremarkable. No pancreatic ductal dilatation or surrounding inflammatory changes. Spleen: Normal in size without focal abnormality. Adrenals/Urinary Tract: Adrenal glands are unremarkable. Kidneys are normal, without renal calculi, focal lesion, or hydronephrosis. Bladder is unremarkable. Stomach/Bowel: No bowel obstruction or ileus. No bowel wall thickening or inflammatory change. The appendix is surgically absent.  Vascular/Lymphatic: Aortic atherosclerosis. No enlarged abdominal or pelvic lymph nodes. Reproductive: Uterus and bilateral adnexa are unremarkable. Other: No free fluid or free gas.  No abdominal wall hernia. Musculoskeletal: No acute or destructive bony lesions. Reconstructed images demonstrate no additional findings. IMPRESSION: 1. Cholelithiasis without cholecystitis. 2. No evidence of urinary tract calculi or obstructive uropathy. 3.  Aortic Atherosclerosis (ICD10-I70.0). Electronically Signed   By: Sharlet Salina M.D.   On: 07/19/2020 16:00   US Renal  Result Date: 07/19/2020 CLINICAL DATA:  Acute renal injury EXAM: RENAL / URINARY TRACT ULTRASOUND COMPLETE COMPARISON:  None. FINDINGS: Right Kidney: Renal measurements: 11.4 x 4.9 x 4.7 cm. = volume: 137 mL. Echogenicity within normal limits. No mass or hydronephrosis visualized. Left Kidney: Renal measurements: 11.1 x 5.5 x 6.3 cm. = volume: 198 mL. Echogenicity within normal limits. No mass or hydronephrosis visualized. Bladder: Appears normal for degree of bladder distention. Other: None. IMPRESSION: No acute abnormality of the kidneys is noted. Electronically Signed   By: Alcide Clever M.D.   On: 07/19/2020 14:49    Microbiology: Recent Results (from the past 240 hour(s))  Respiratory Panel by RT PCR (Flu A&B, Covid) - Nasopharyngeal Swab     Status: None   Collection Time: 07/19/20 12:53 PM   Specimen: Nasopharyngeal Swab  Result Value Ref Range Status   SARS Coronavirus 2 by RT PCR NEGATIVE NEGATIVE Final    Comment: (NOTE) SARS-CoV-2 target nucleic acids are NOT DETECTED.  The SARS-CoV-2 RNA is generally detectable in upper respiratoy specimens during the acute phase of infection. The lowest concentration of SARS-CoV-2 viral copies this assay can detect is 131 copies/mL. A negative result does not preclude SARS-Cov-2 infection and should not be used as the sole basis for treatment or other patient management decisions. A negative  result may occur with  improper specimen collection/handling, submission of specimen other than nasopharyngeal swab, presence of viral mutation(s) within the areas targeted by this assay, and inadequate number of viral copies (<131 copies/mL). A negative result must be combined with clinical observations, patient history, and epidemiological information. The expected result is Negative.  Fact Sheet for Patients:  https://www.moore.com/  Fact Sheet for Healthcare Providers:  https://www.young.biz/  This test is no t yet approved or cleared by the [place] FDA and  has been authorized for detection and/or diagnosis of SARS-CoV-2 by FDA under an Emergency Use Authorization (EUA). This EUA will remain  in effect (meaning this test can be used) for the duration of the COVID-19 declaration under Section 564(b)(1) of the Act, 21 U.S.C. section 360bbb-3(b)(1), unless the authorization is terminated or revoked sooner.     Influenza A by PCR NEGATIVE NEGATIVE Final   Influenza B by PCR NEGATIVE NEGATIVE Final  Comment: (NOTE) The Xpert Xpress SARS-CoV-2/FLU/RSV assay is intended as an aid in  the diagnosis of influenza from Nasopharyngeal swab specimens and  should not be used as a sole basis for treatment. Nasal washings and  aspirates are unacceptable for Xpert Xpress SARS-CoV-2/FLU/RSV  testing.  Fact Sheet for Patients: https://www.moore.com/  Fact Sheet for Healthcare Providers: https://www.young.biz/  This test is not yet approved or cleared by the [place] FDA and  has been authorized for detection and/or diagnosis of SARS-CoV-2 by  FDA under an Emergency Use Authorization (EUA). This EUA will remain  in effect (meaning this test can be used) for the duration of the  Covid-19 declaration under Section 564(b)(1) of the Act, 21  U.S.C. section 360bbb-3(b)(1), unless the authorization is   terminated or revoked. Performed at [facility], [address] [place], [place] [address]      Labs: Basic Metabolic Panel: Recent Labs  Lab 07/19/20 1211 07/20/20 0307 07/21/20 0055 07/22/20 0142  NA 132* 138 142 141  K 5.5* 4.7 3.4* 2.8*  CL 104 109 99 100  CO2 12* 16* 29 32  GLUCOSE 173* 108* 84 92  BUN 52* 43* 31* 21*  CREATININE 6.34* 5.38* 3.84* 2.49*  CALCIUM 9.2 7.8* 7.4* 6.7*  PHOS  --  5.0* 2.9 2.4*   Liver Function Tests: Recent Labs  Lab 07/19/20 1211 07/20/20 0307 07/21/20 0055 07/22/20 0142  AST 14* 11*  --   --   ALT 9 7  --   --   ALKPHOS 41 33*  --   --   BILITOT 1.2 1.2  --   --   PROT 6.4* 5.1*  --   --   ALBUMIN 3.5 2.8*  2.8* 2.8* 2.7*   Recent Labs  Lab 07/19/20 1211  LIPASE 921*   No results for input(s): AMMONIA in the last 168 hours. CBC: Recent Labs  Lab 07/19/20 1211 07/20/20 0307 07/21/20 1220 07/22/20 0142  WBC 12.0* 11.0* 12.2* 9.6  HGB 12.0 10.1* 11.7* 9.7*  HCT 38.0 31.2* 35.5* 29.0*  MCV 96.9 94.5 91.0 91.2  PLT 375 313 326 273   Cardiac Enzymes: No results for input(s): CKTOTAL, CKMB, CKMBINDEX, TROPONINI in the last 168 hours. BNP: BNP (last 3 results) No results for input(s): BNP in the last 8760 hours.  ProBNP (last 3 results) No results for input(s): PROBNP in the last 8760 hours.  CBG: Recent Labs  Lab 07/21/20 1648 07/21/20 2103 07/22/20 0729 07/22/20 1150 07/22/20 1412  GLUCAP 110* 118* 126* 66* 156*    Principal Problem:   Acute alcoholic pancreatitis Active Problems:   Alcohol dependence with uncomplicated withdrawal (HCC)   Tobacco dependence   HTN (hypertension)   Hyperlipidemia   Acute renal failure (ARF) (HCC)   Time coordinating discharge: 38 minutes.  Signed:        Nabor Thomann, DO [facility]  07/22/2020, 2:39 PM

## 2020-07-22 NOTE — Progress Notes (Addendum)
Patient was discharged home by MD order; discharged instructions  review and give to patient with care notes; IV DIC; skin intact; patient refused to be escorted to the car by staff. She left accompanied by family.

## 2020-07-22 NOTE — Progress Notes (Signed)
Subjective: Patient has been tolerating solid diet without associated abdominal pain. She reports having several loose bowel movements. She states that the abdominal pain is 0 out of 10 today and is requesting to go home.  Objective: Vital signs in last 24 hours: Temp:  [97.8 F (36.6 C)-98.4 F (36.9 C)] 98.4 F (36.9 C) (11/13 0727) Pulse Rate:  [52-62] 57 (11/13 1100) Resp:  [19-22] 20 (11/13 0727) BP: (152-166)/(75-99) 166/99 (11/13 0727) SpO2:  [96 %-98 %] 98 % (11/13 0727) Weight:  [51 kg] 51 kg (11/13 0424) Weight change:  Last BM Date: 07/21/20  PE: Mild pallor, no icterus GENERAL: Alert, oriented x3, not in distress ABDOMEN: Soft, nondistended, nontender, normoactive bowel sounds EXTREMITIES: No deformity, no edema  Lab Results: Results for orders placed or performed during the hospital encounter of 07/19/20 (from the past 48 hour(s))  Glucose, capillary     Status: None   Collection Time: 07/20/20  4:01 PM  Result Value Ref Range   Glucose-Capillary 96 70 - 99 mg/dL    Comment: Glucose reference range applies only to samples taken after fasting for at least 8 hours.  Glucose, capillary     Status: Abnormal   Collection Time: 07/20/20  8:25 PM  Result Value Ref Range   Glucose-Capillary 119 (H) 70 - 99 mg/dL    Comment: Glucose reference range applies only to samples taken after fasting for at least 8 hours.  Renal function panel     Status: Abnormal   Collection Time: 07/21/20 12:55 AM  Result Value Ref Range   Sodium 142 135 - 145 mmol/L   Potassium 3.4 (L) 3.5 - 5.1 mmol/L   Chloride 99 98 - 111 mmol/L   CO2 29 22 - 32 mmol/L   Glucose, Bld 84 70 - 99 mg/dL    Comment: Glucose reference range applies only to samples taken after fasting for at least 8 hours.   BUN 31 (H) 6 - 20 mg/dL   Creatinine, Ser 2.44 (H) 0.44 - 1.00 mg/dL   Calcium 7.4 (L) 8.9 - 10.3 mg/dL   Phosphorus 2.9 2.5 - 4.6 mg/dL   Albumin 2.8 (L) 3.5 - 5.0 g/dL   GFR, Estimated 13 (L) >60  mL/min    Comment: (NOTE) Calculated using the CKD-EPI Creatinine Equation (2021)    Anion gap 14 5 - 15    Comment: Performed at [facility], [address] [place], [place] [address]  Glucose, capillary     Status: Abnormal   Collection Time: 07/21/20  8:06 AM  Result Value Ref Range   Glucose-Capillary 122 (H) 70 - 99 mg/dL    Comment: Glucose reference range applies only to samples taken after fasting for at least 8 hours.  Glucose, capillary     Status: Abnormal   Collection Time: 07/21/20 11:47 AM  Result Value Ref Range   Glucose-Capillary 60 (L) 70 - 99 mg/dL    Comment: Glucose reference range applies only to samples taken after fasting for at least 8 hours.  Glucose, capillary     Status: Abnormal   Collection Time: 07/21/20 12:10 PM  Result Value Ref Range   Glucose-Capillary 114 (H) 70 - 99 mg/dL    Comment: Glucose reference range applies only to samples taken after fasting for at least 8 hours.  CBC     Status: Abnormal   Collection Time: 07/21/20 12:20 PM  Result Value Ref Range   WBC 12.2 (H) 4.0 - 10.5 K/uL   RBC  3.90 3.87 - 5.11 MIL/uL   Hemoglobin 11.7 (L) 12.0 - 15.0 g/dL   HCT 19.5 (L) 36 - 46 %   MCV 91.0 80.0 - 100.0 fL   MCH 30.0 26.0 - 34.0 pg   MCHC 33.0 30.0 - 36.0 g/dL   RDW 09.3 26.7 - 12.4 %   Platelets 326 150 - 400 K/uL   nRBC 0.0 0.0 - 0.2 %    Comment: Performed at [facility], [address], [place], [place] [address]  Glucose, capillary     Status: Abnormal   Collection Time: 07/21/20  4:48 PM  Result Value Ref Range   Glucose-Capillary 110 (H) 70 - 99 mg/dL    Comment: Glucose reference range applies only to samples taken after fasting for at least 8 hours.  Glucose, capillary     Status: Abnormal   Collection Time: 07/21/20  9:03 PM  Result Value Ref Range   Glucose-Capillary 118 (H) 70 - 99 mg/dL    Comment: Glucose reference range applies only to samples taken after fasting for at least 8 hours.  Renal function panel      Status: Abnormal   Collection Time: 07/22/20  1:42 AM  Result Value Ref Range   Sodium 141 135 - 145 mmol/L   Potassium 2.8 (L) 3.5 - 5.1 mmol/L   Chloride 100 98 - 111 mmol/L   CO2 32 22 - 32 mmol/L   Glucose, Bld 92 70 - 99 mg/dL    Comment: Glucose reference range applies only to samples taken after fasting for at least 8 hours.   BUN 21 (H) 6 - 20 mg/dL   Creatinine, Ser 8.33 (H) 0.44 - 1.00 mg/dL    Comment: DELTA CHECK NOTED   Calcium 6.7 (L) 8.9 - 10.3 mg/dL   Phosphorus 2.4 (L) 2.5 - 4.6 mg/dL   Albumin 2.7 (L) 3.5 - 5.0 g/dL   GFR, Estimated 22 (L) >60 mL/min    Comment: (NOTE) Calculated using the CKD-EPI Creatinine Equation (2021)    Anion gap 9 5 - 15    Comment: Performed at [facility], [address] [place], [place] [address]  CBC     Status: Abnormal   Collection Time: 07/22/20  1:42 AM  Result Value Ref Range   WBC 9.6 4.0 - 10.5 K/uL   RBC 3.18 (L) 3.87 - 5.11 MIL/uL   Hemoglobin 9.7 (L) 12.0 - 15.0 g/dL   HCT 39.7 (L) 36 - 46 %   MCV 91.2 80.0 - 100.0 fL   MCH 30.5 26.0 - 34.0 pg   MCHC 33.4 30.0 - 36.0 g/dL   RDW 67.3 41.9 - 37.9 %   Platelets 273 150 - 400 K/uL   nRBC 0.0 0.0 - 0.2 %    Comment: Performed at [facility], [address] [place], [place] [address]  Glucose, capillary     Status: Abnormal   Collection Time: 07/22/20  7:29 AM  Result Value Ref Range   Glucose-Capillary 126 (H) 70 - 99 mg/dL    Comment: Glucose reference range applies only to samples taken after fasting for at least 8 hours.  Glucose, capillary     Status: Abnormal   Collection Time: 07/22/20 11:50 AM  Result Value Ref Range   Glucose-Capillary 66 (L) 70 - 99 mg/dL    Comment: Glucose reference range applies only to samples taken after fasting for at least 8 hours.    Studies/Results: No results found.  Medications: I have reviewed the patient's current medications.  Assessment: Acute alcohol related pancreatitis Renal impairment-improving,  creatinine was 6.34 on admission and is 2.49 today CT without contrast showed cholelithiasis without cholecystitis  Plan: From GI standpoint as patient is able to tolerate diet okay to DC home. Discussed about alcohol cessation with the patient in details.  Continue folic acid, multivitamin, thiamine Although patient has no evidence of cholecystitis and has normal LFTs, she may benefit from evaluation for cholecystectomy as an outpatient. We will sign off, please recall if needed.  Kerin Salen, MD 07/22/2020, 1:12 PM

## 2021-03-14 IMAGING — CT CT ABD-PELV W/O CM
2 of 4 series · 16 of 46 positions shown, 18 images · non-contrast
Comparison: 07/19/2020, 03/14/2012

CLINICAL DATA: Abdominal pain, acute renal insufficiency

EXAM:
CT ABDOMEN AND PELVIS WITHOUT CONTRAST
TECHNIQUE: Multidetector CT imaging of the abdomen and pelvis was performed
following the standard protocol without IV contrast.

[Series 3: a/p w/o 5mm · axial · non-contrast · 0.60mm/px · z∈[-501,-141]mm · 13 of 80 slices shown, 15 images]
[im 4/80  soft-tissue]
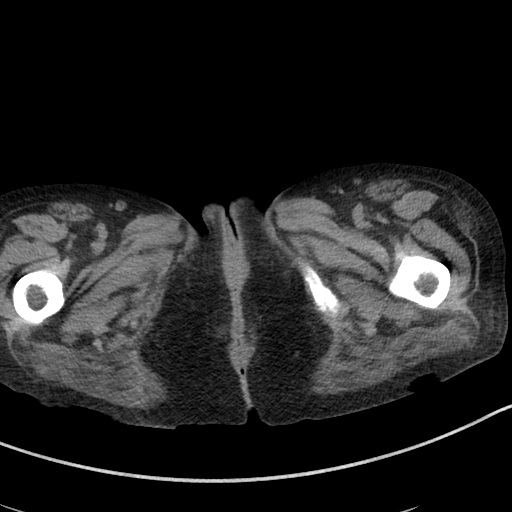
[im 4/80  bone]
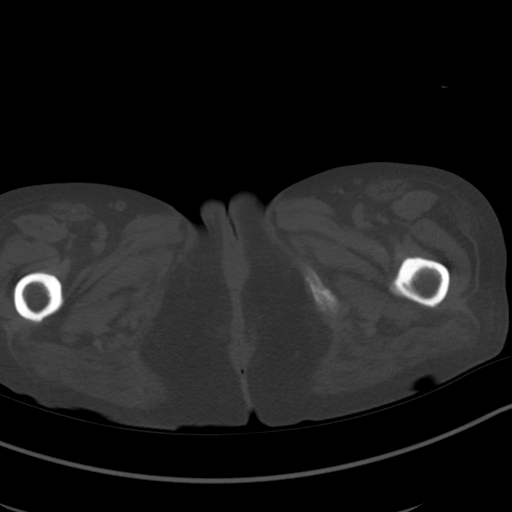
[im 10/80  soft-tissue]
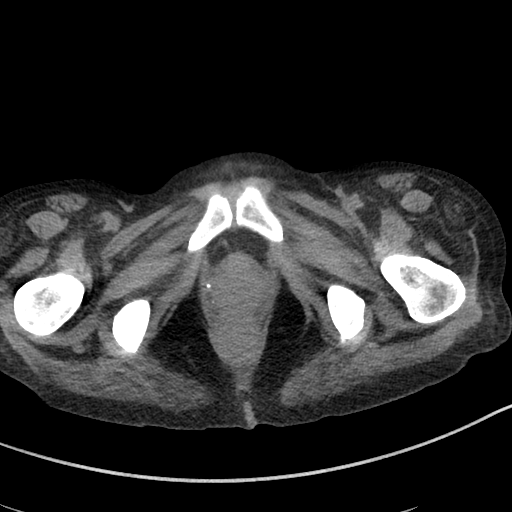
[im 16/80  soft-tissue]
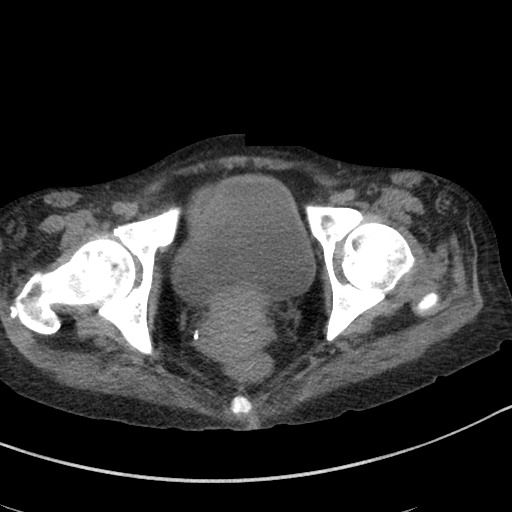
[im 22/80  soft-tissue]
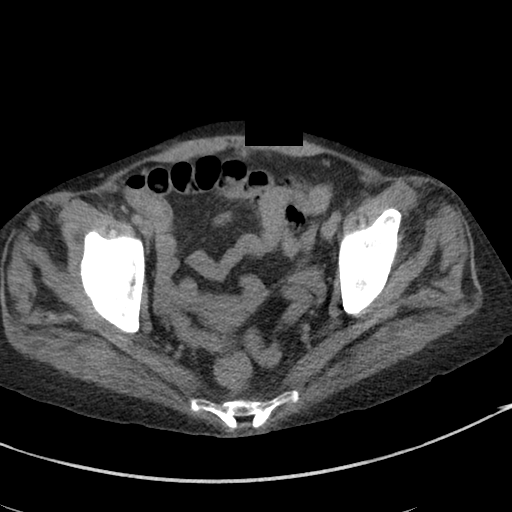
[im 28/80  soft-tissue]
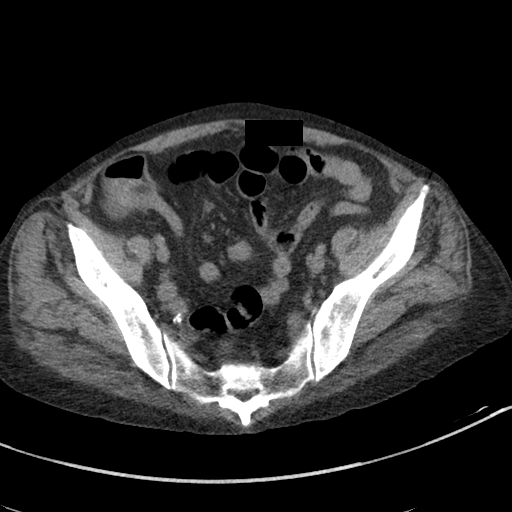
[im 34/80  soft-tissue]
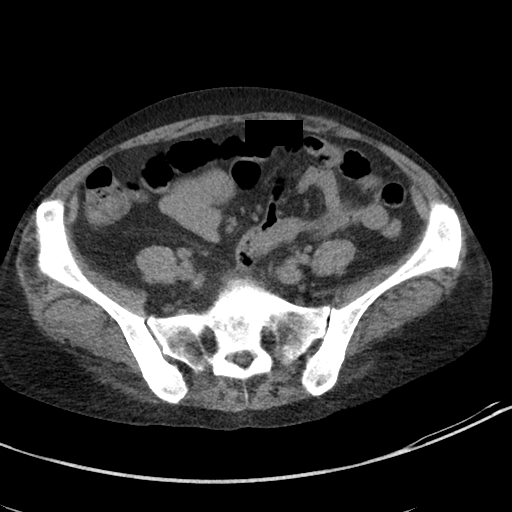
[im 40/80  soft-tissue]
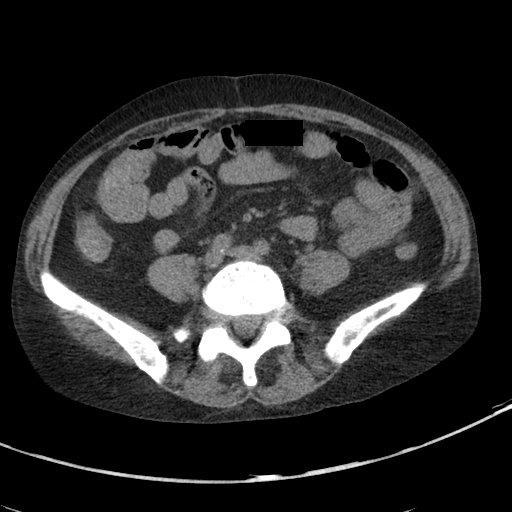
[im 46/80  soft-tissue]
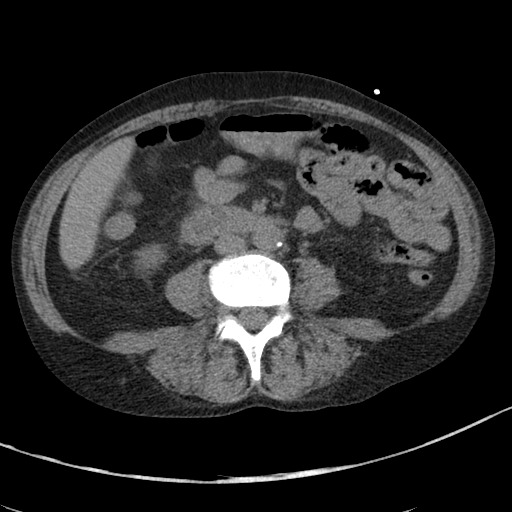
[im 52/80  soft-tissue]
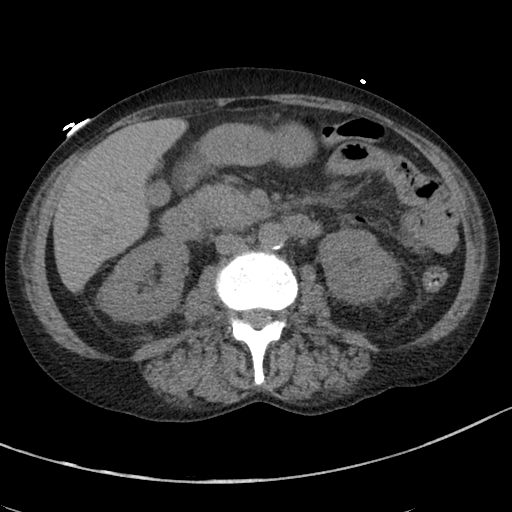
[im 52/80  bone]
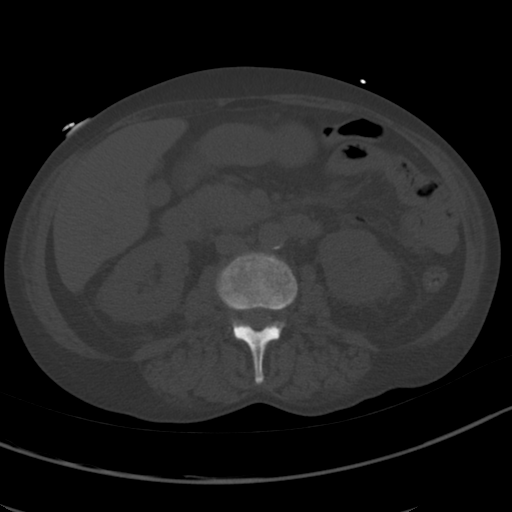
[im 58/80  soft-tissue]
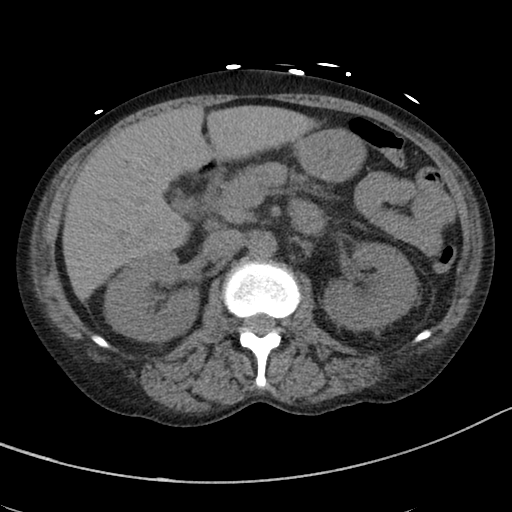
[im 64/80  soft-tissue]
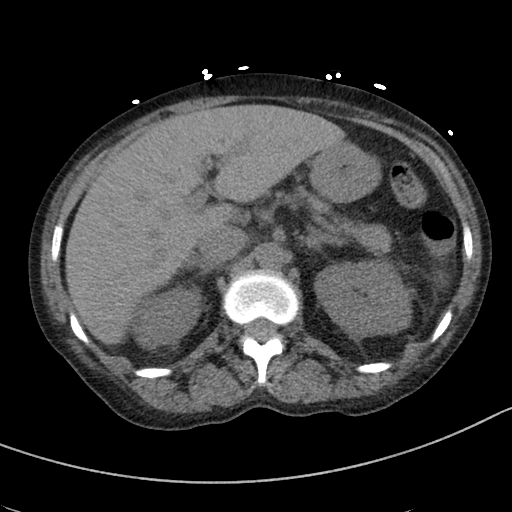
[im 70/80  soft-tissue]
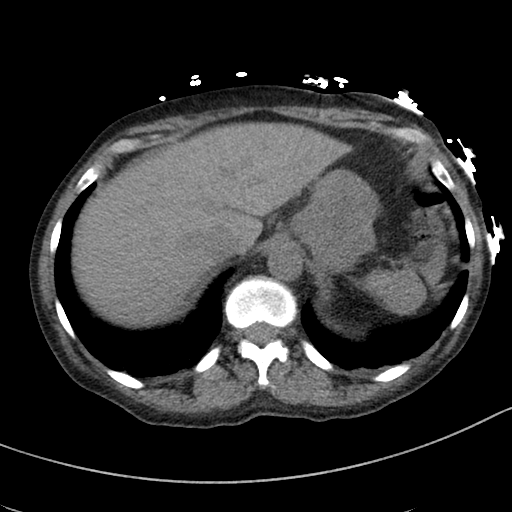
[im 76/80  soft-tissue]
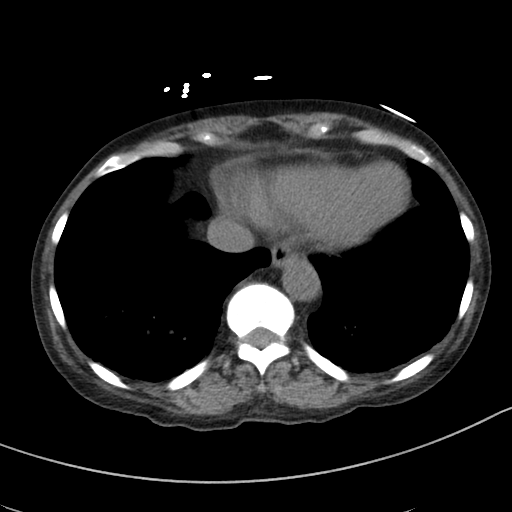

[Series 6: a/p w/o cor · coronal · non-contrast · 0.61mm/px · 3 of 112 slices shown]
[im 38/112  soft-tissue]
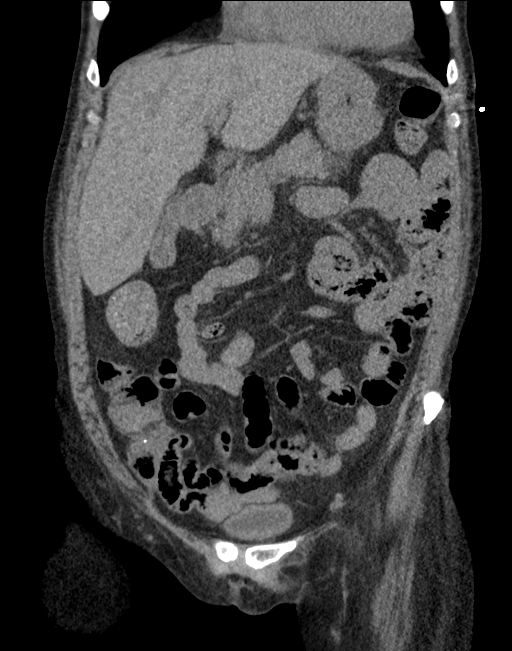
[im 50/112  soft-tissue]
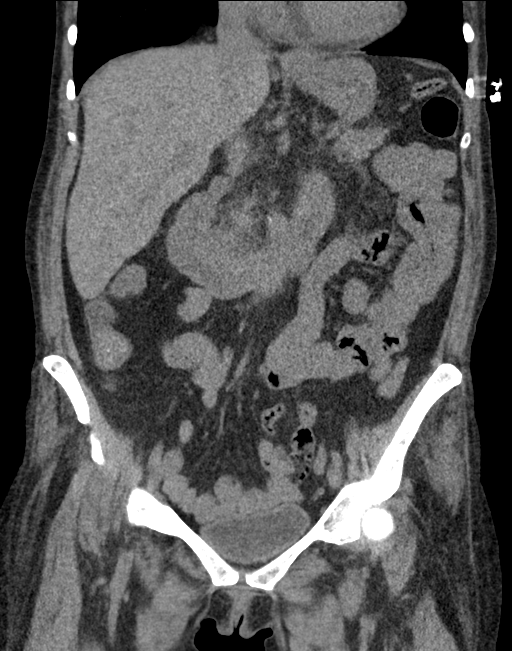
[im 62/112  soft-tissue]
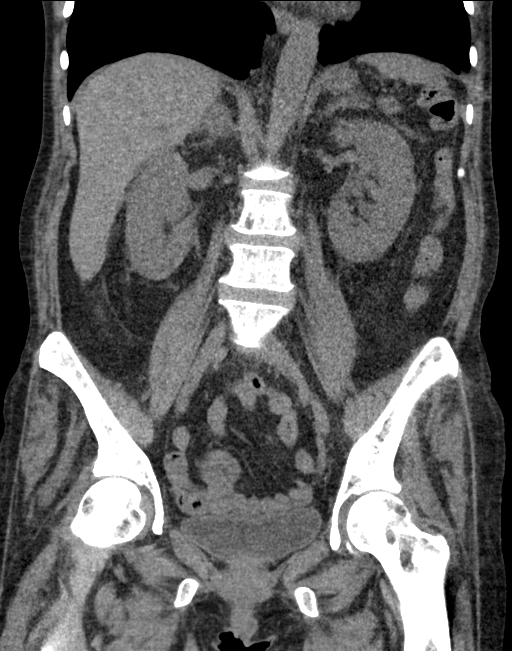

[16 of 46 positions shown; findings below may reference images not displayed]

FINDINGS: Lower chest: No acute pleural or parenchymal lung disease.

Hepatobiliary: Calcified gallstone without cholecystitis. Liver is
unremarkable.

Pancreas: Unremarkable. No pancreatic ductal dilatation or
surrounding inflammatory changes.

Spleen: Normal in size without focal abnormality.

Adrenals/Urinary Tract: Adrenal glands are unremarkable. Kidneys are
normal, without renal calculi, focal lesion, or hydronephrosis.
Bladder is unremarkable.

Stomach/Bowel: No bowel obstruction or ileus. No bowel wall
thickening or inflammatory change. The appendix is surgically
absent.

Vascular/Lymphatic: Aortic atherosclerosis. No enlarged abdominal or
pelvic lymph nodes.

Reproductive: Uterus and bilateral adnexa are unremarkable.

Other: No free fluid or free gas.  No abdominal wall hernia.

Musculoskeletal: No acute or destructive bony lesions. Reconstructed
images demonstrate no additional findings.
IMPRESSION: 1. Cholelithiasis without cholecystitis.
2. No evidence of urinary tract calculi or obstructive uropathy.
3.  Aortic Atherosclerosis ([id number]-161.1).

## 2021-03-14 IMAGING — US US RENAL
1 series · 14 of 25 positions shown · non-contrast
Comparison: None.

CLINICAL DATA: Acute renal injury

EXAM:
RENAL / URINARY TRACT ULTRASOUND COMPLETE

[Series 1: us renal · 14 of 26 slices shown]
[im 1/26]
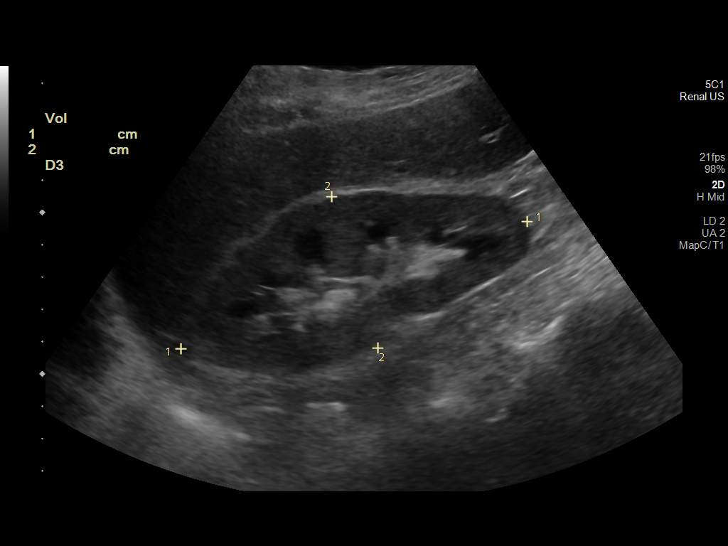
[im 3/26]
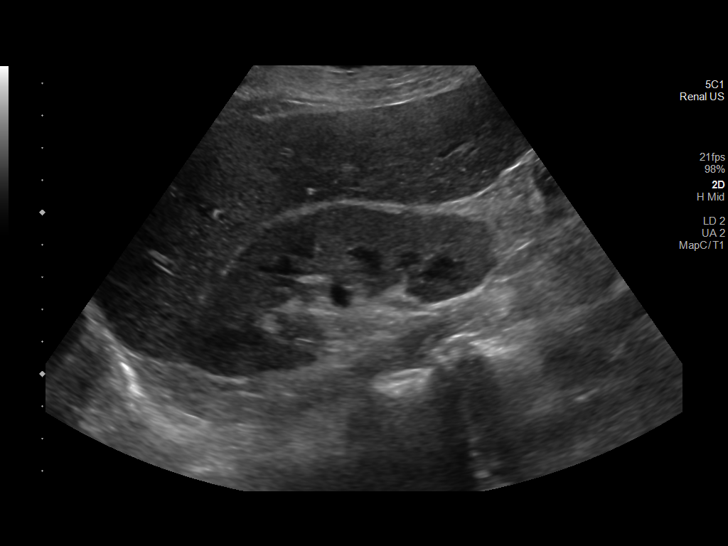
[im 5/26]
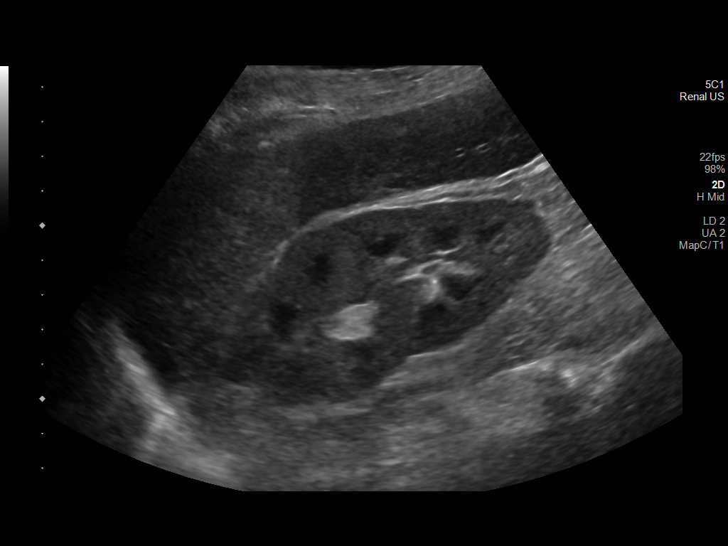
[im 7/26]
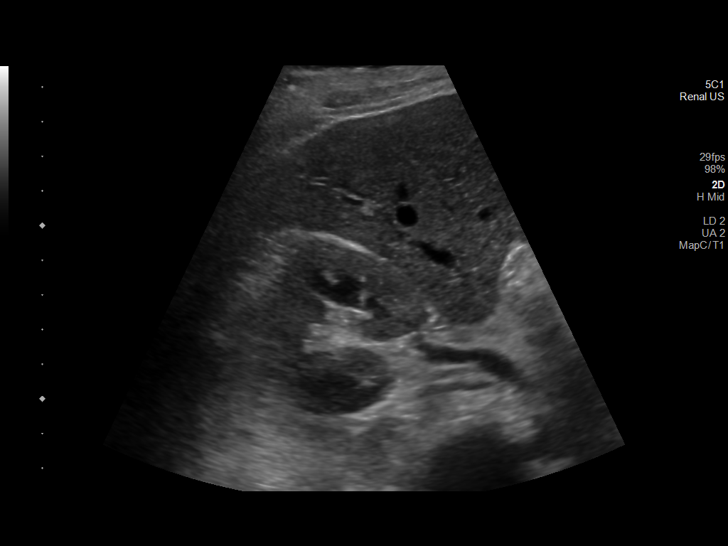
[im 9/26]
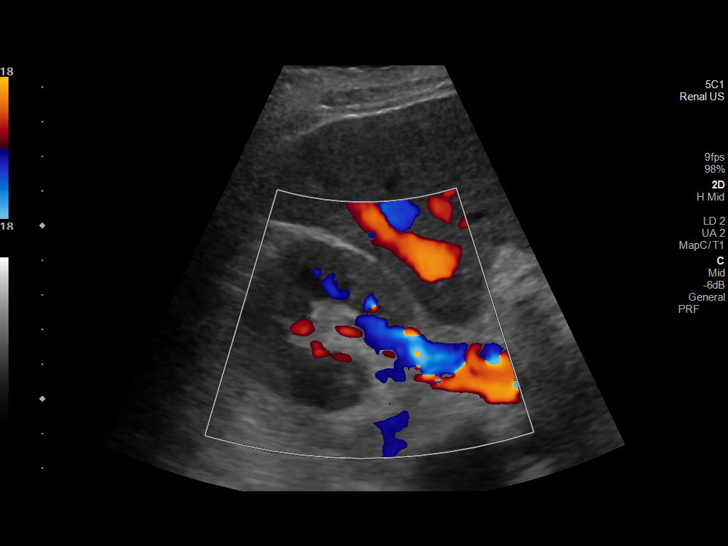
[im 10/26]
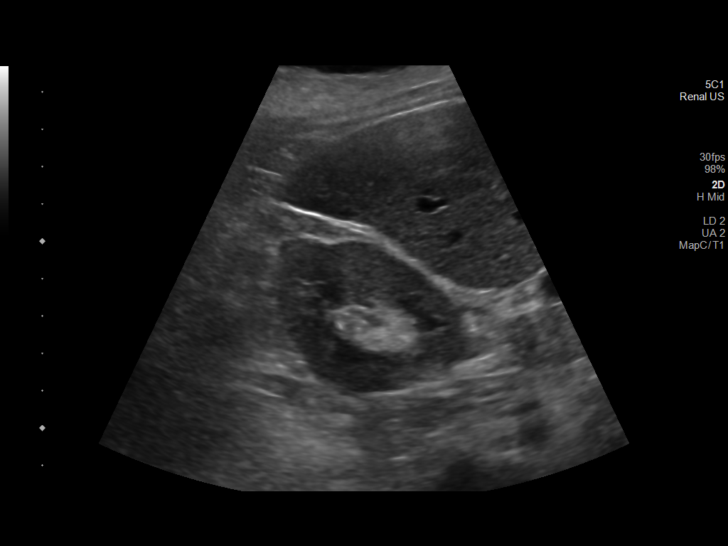
[im 12/26]
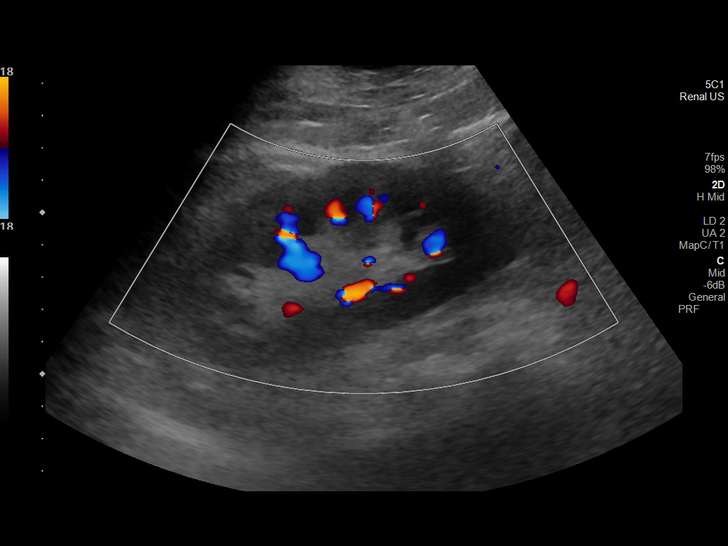
[im 14/26]
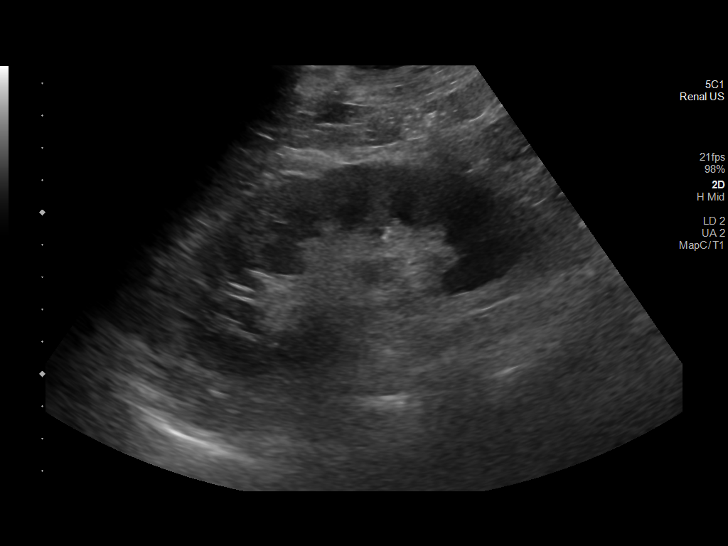
[im 16/26]
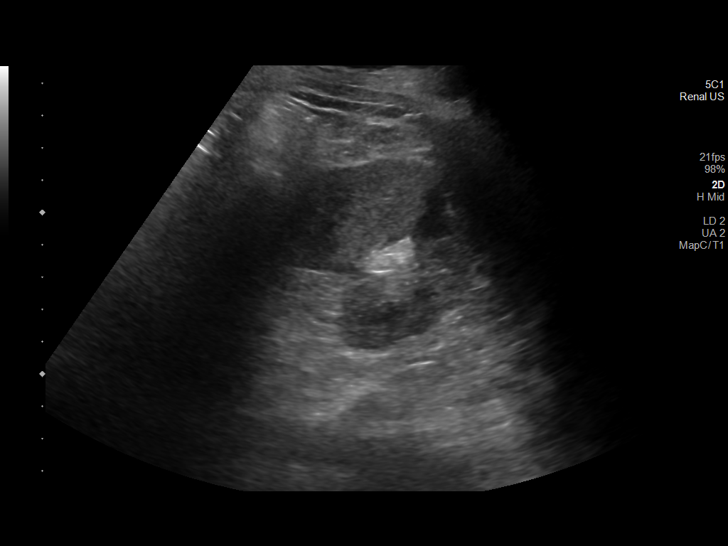
[im 17/26]
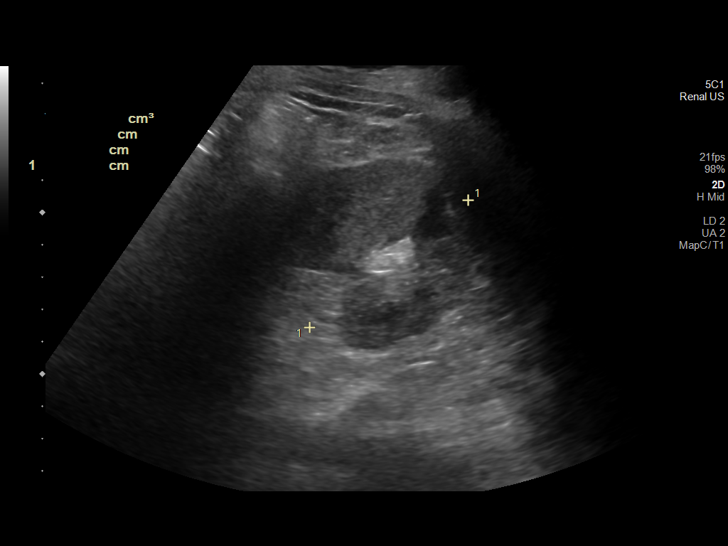
[im 19/26]
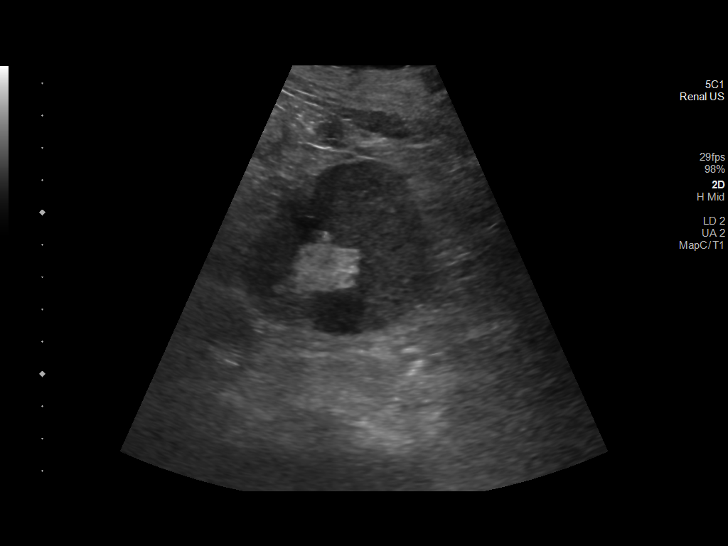
[im 21/26]
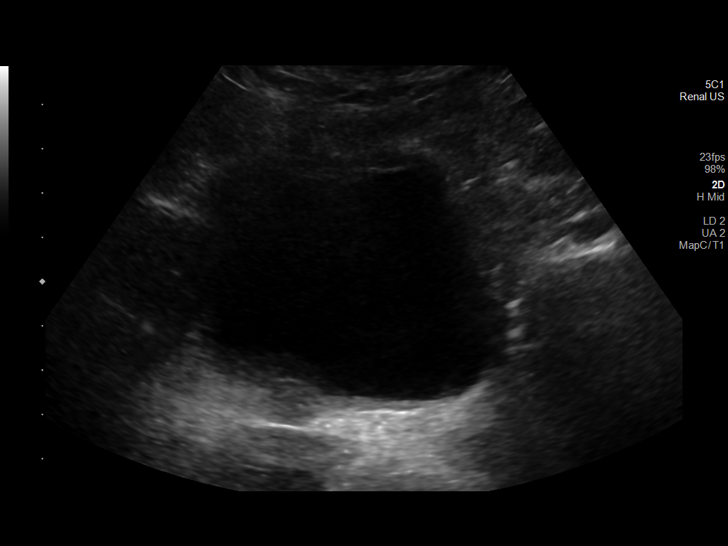
[im 23/26]
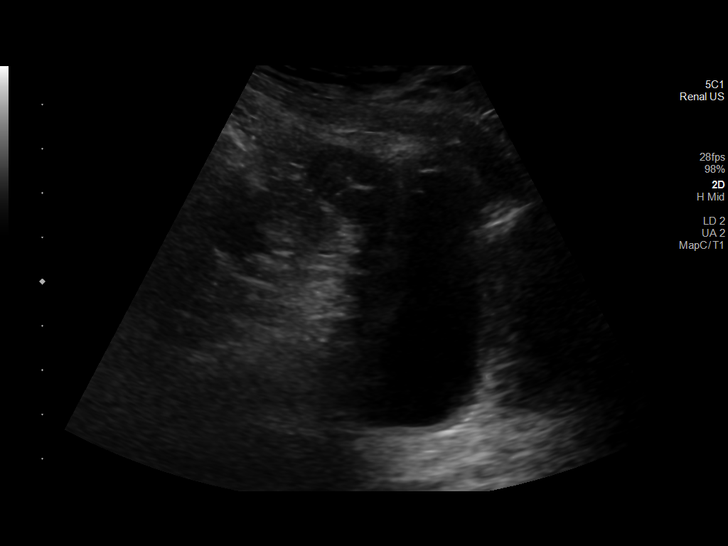
[im 26/26]
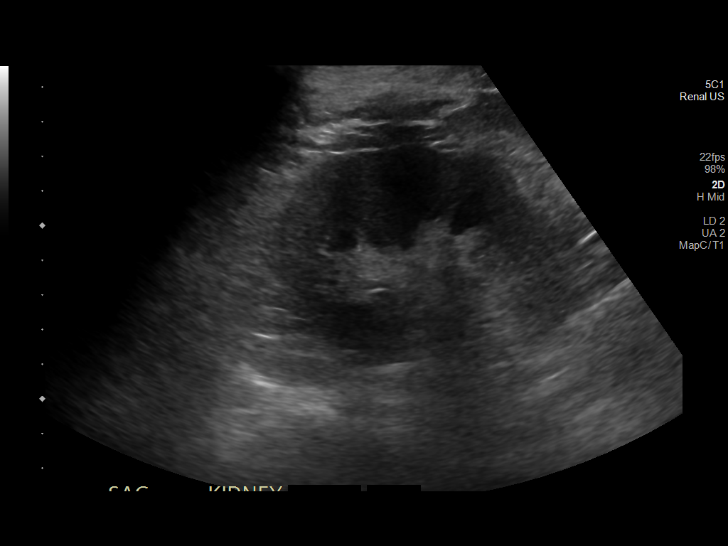

[14 of 25 positions shown; findings below may reference images not displayed]

FINDINGS: Right Kidney:

Renal measurements: 11.4 x 4.9 x 4.7 cm. = volume: 137 mL.
Echogenicity within normal limits. No mass or hydronephrosis
visualized.

Left Kidney:

Renal measurements: 11.1 x 5.5 x 6.3 cm. = volume: 198 mL.
Echogenicity within normal limits. No mass or hydronephrosis
visualized.

Bladder:

Appears normal for degree of bladder distention.

Other:

None.
IMPRESSION: No acute abnormality of the kidneys is noted.

## 2022-04-03 ENCOUNTER — Encounter (HOSPITAL_COMMUNITY): Payer: Self-pay | Admitting: Emergency Medicine

## 2022-04-03 ENCOUNTER — Emergency Department (HOSPITAL_COMMUNITY)
Admission: EM | Admit: 2022-04-03 | Discharge: 2022-04-03 | Disposition: A | Discharge status: Home / Self Care | Payer: No Typology Code available for payment source | Attending: Emergency Medicine | Admitting: Emergency Medicine

## 2022-04-03 ENCOUNTER — Emergency Department (HOSPITAL_COMMUNITY): Payer: No Typology Code available for payment source

## 2022-04-03 ENCOUNTER — Other Ambulatory Visit: Payer: Self-pay

## 2022-04-03 DIAGNOSIS — D72829 Elevated white blood cell count, unspecified: Secondary | ICD-10-CM | POA: Insufficient documentation

## 2022-04-03 DIAGNOSIS — E1165 Type 2 diabetes mellitus with hyperglycemia: Secondary | ICD-10-CM | POA: Diagnosis present

## 2022-04-03 DIAGNOSIS — Z79899 Other long term (current) drug therapy: Secondary | ICD-10-CM | POA: Insufficient documentation

## 2022-04-03 DIAGNOSIS — Z7984 Long term (current) use of oral hypoglycemic drugs: Secondary | ICD-10-CM | POA: Diagnosis not present

## 2022-04-03 DIAGNOSIS — K8020 Calculus of gallbladder without cholecystitis without obstruction: Secondary | ICD-10-CM | POA: Diagnosis not present

## 2022-04-03 DIAGNOSIS — R970 Elevated carcinoembryonic antigen [CEA]: Secondary | ICD-10-CM | POA: Diagnosis not present

## 2022-04-03 DIAGNOSIS — Z7982 Long term (current) use of aspirin: Secondary | ICD-10-CM | POA: Insufficient documentation

## 2022-04-03 DIAGNOSIS — I10 Essential (primary) hypertension: Secondary | ICD-10-CM | POA: Diagnosis not present

## 2022-04-03 LAB — CBC
HCT: 40.3 % (ref 36.0–46.0)
Hemoglobin: 12.9 g/dL (ref 12.0–15.0)
MCH: 27.8 pg (ref 26.0–34.0)
MCHC: 32 g/dL (ref 30.0–36.0)
MCV: 86.9 fL (ref 80.0–100.0)
Platelets: 368 10*3/uL (ref 150–400)
RBC: 4.64 MIL/uL (ref 3.87–5.11)
RDW: 13.2 % (ref 11.5–15.5)
WBC: 11.9 10*3/uL — ABNORMAL HIGH (ref 4.0–10.5)
nRBC: 0 % (ref 0.0–0.2)

## 2022-04-03 LAB — I-STAT VENOUS BLOOD GAS, ED
Acid-base deficit: 1 mmol/L (ref 0.0–2.0)
Bicarbonate: 25.8 mmol/L (ref 20.0–28.0)
Calcium, Ion: 1.18 mmol/L (ref 1.15–1.40)
HCT: 42 % (ref 36.0–46.0)
Hemoglobin: 14.3 g/dL (ref 12.0–15.0)
O2 Saturation: 23 %
Potassium: 3.9 mmol/L (ref 3.5–5.1)
Sodium: 130 mmol/L — ABNORMAL LOW (ref 135–145)
TCO2: 27 mmol/L (ref 22–32)
pCO2, Ven: 48.8 mmHg (ref 44–60)
pH, Ven: 7.331 (ref 7.25–7.43)
pO2, Ven: 18 mmHg — CL (ref 32–45)

## 2022-04-03 LAB — URINALYSIS, ROUTINE W REFLEX MICROSCOPIC
Bilirubin Urine: NEGATIVE
Glucose, UA: >=500 mg/dL — AB
Hgb urine dipstick: NEGATIVE
Ketones, ur: NEGATIVE mg/dL
Nitrite: NEGATIVE
Protein, ur: NEGATIVE mg/dL
Specific Gravity, Urine: 1.023 (ref 1.005–1.030)
pH: 5 (ref 5.0–8.0)

## 2022-04-03 LAB — HEPATIC FUNCTION PANEL
ALT: 23 U/L (ref 0–44)
AST: 21 U/L (ref 15–41)
Albumin: 3.4 g/dL — ABNORMAL LOW (ref 3.5–5.0)
Alkaline Phosphatase: 187 U/L — ABNORMAL HIGH (ref 38–126)
Bilirubin, Direct: <0.1 mg/dL (ref 0.0–0.2)
Total Bilirubin: 0.4 mg/dL (ref 0.3–1.2)
Total Protein: 6.5 g/dL (ref 6.5–8.1)

## 2022-04-03 LAB — CBG MONITORING, ED
Glucose-Capillary: >600 mg/dL (ref 70–99)
Glucose-Capillary: >600 mg/dL (ref 70–99)
Glucose-Capillary: 356 mg/dL — ABNORMAL HIGH (ref 70–99)
Glucose-Capillary: 581 mg/dL (ref 70–99)
Glucose-Capillary: 253 mg/dL — ABNORMAL HIGH (ref 70–99)

## 2022-04-03 LAB — BASIC METABOLIC PANEL
Anion gap: 11 (ref 5–15)
BUN: 27 mg/dL — ABNORMAL HIGH (ref 8–23)
CO2: 22 mmol/L (ref 22–32)
Calcium: 9.1 mg/dL (ref 8.9–10.3)
Chloride: 92 mmol/L — ABNORMAL LOW (ref 98–111)
Creatinine, Ser: 2.04 mg/dL — ABNORMAL HIGH (ref 0.44–1.00)
GFR, Estimated: 27 mL/min — ABNORMAL LOW (ref >60)
Glucose, Bld: 925 mg/dL (ref 70–99)
Potassium: 5.1 mmol/L (ref 3.5–5.1)
Sodium: 125 mmol/L — ABNORMAL LOW (ref 135–145)

## 2022-04-03 LAB — BETA-HYDROXYBUTYRIC ACID: Beta-Hydroxybutyric Acid: 0.15 mmol/L (ref 0.05–0.27)

## 2022-04-03 LAB — LIPASE, BLOOD: Lipase: 94 U/L — ABNORMAL HIGH (ref 11–51)

## 2022-04-03 MED ORDER — PIOGLITAZONE HCL 15 MG PO TABS: 15.0000 mg | ORAL_TABLET | Freq: Every day | ORAL | 0 refills | Status: AC

## 2022-04-03 MED ORDER — SODIUM CHLORIDE 0.9 % IV BOLUS
1000.0000 mL | Freq: Once | INTRAVENOUS | Status: AC
  Administered 2022-04-03: 1000 mL via INTRAVENOUS

## 2022-04-03 MED ORDER — INSULIN ASPART 100 UNIT/ML IJ SOLN
10.0000 [IU] | Freq: Once | INTRAMUSCULAR | Status: AC
  Administered 2022-04-03: 10 [IU] via INTRAVENOUS

## 2022-04-03 NOTE — ED Provider Notes (Signed)
[facility] EMERGENCY DEPARTMENT Provider Note   CSN: [id number] Arrival date & time: 04/03/22  1226     History  Chief Complaint  Patient presents with   Hyperglycemia    Lisa Blanchard is a 61 y.o. female.  Pt is a 61 yo female with a pmhx significant for DM, HTN, GERD, and high cholesterol.  She was on Metformin, but was taken off a few years ago because her blood sugars were elevated.  She went to see her doctor today and her BS was reading hi, so she was sent here.  Pt said she has been thirsty and has been urinating a lot, but feels ok.  She does report occasional RUQ pain.  Pt denies any n/v.  No diarrhea.       Home Medications Prior to Admission medications   Medication Sig Start Date End Date Taking? Authorizing Provider  pioglitazone (ACTOS) 15 MG tablet Take 1 tablet (15 mg total) by mouth daily. 04/03/22  Yes Isla Pence, MD  amLODipine (NORVASC) 5 MG tablet Take 1 tablet (5 mg total) by mouth daily. 03/16/12 03/16/13  Acquanetta Chain, DO  aspirin EC 81 MG tablet Take 81 mg by mouth daily. Swallow whole.    [provider]  Cholecalciferol (VITAMIN D) 50 MCG (2000 UT) tablet Take 2,000 Units by mouth daily.  03/27/20   [provider]  folic acid (FOLVITE) 1 MG tablet Take 1 mg by mouth daily. 06/15/20   [provider]  losartan (COZAAR) 100 MG tablet Take 100 mg by mouth daily. 07/06/20   [provider]  metFORMIN (GLUCOPHAGE) 1000 MG tablet Take 1,000 mg by mouth 2 (two) times daily. 06/15/20   [provider]  metoprolol tartrate (LOPRESSOR) 25 MG tablet Take 12.5 mg by mouth 2 (two) times daily.  06/15/20   [provider]  Multiple Vitamin (MULTIVITAMIN WITH MINERALS) TABS tablet Take 1 tablet by mouth daily. 07/23/20   Swayze, Ava, DO  Nutritional Supplements (FEEDING SUPPLEMENT, NEPRO CARB STEADY,) LIQD Take 237 mLs by mouth 3 (three) times daily as needed (Supplement). 07/22/20   Swayze,  Ava, DO  omeprazole (PRILOSEC) 20 MG capsule Take 1 capsule (20 mg total) by mouth daily. Patient not taking: Reported on 07/31/2016 03/16/12 03/16/13  Acquanetta Chain, DO  Polyethyl Glycol-Propyl Glycol (SYSTANE) 0.4-0.3 % SOLN Apply 1 drop to eye 4 (four) times daily as needed (dry eyes).    [provider]  potassium chloride SA (KLOR-CON) 20 MEQ tablet Take 1 tablet (20 mEq total) by mouth daily. 07/22/20 07/22/21  Swayze, Ava, DO  simvastatin (ZOCOR) 40 MG tablet Take 40 mg by mouth at bedtime. 05/24/20   [provider]  thiamine 100 MG tablet Take 1 tablet (100 mg total) by mouth daily. 07/23/20   Swayze, Ava, DO      Allergies    Patient has no known allergies.    Review of Systems   Review of Systems  Gastrointestinal:  Positive for abdominal pain.  Endocrine: Positive for polydipsia and polyuria.    Physical Exam Updated Vital Signs BP (!) 147/83   Pulse 70   Temp 98.5 F (36.9 C) (Oral)   Resp (!) 23   SpO2 99%  Physical Exam Vitals and nursing note reviewed.  Constitutional:      Appearance: Normal appearance.  HENT:     Head: Normocephalic and atraumatic.     Right Ear: External ear normal.     Left Ear: External  ear normal.     Nose: Nose normal.     Mouth/Throat:     Mouth: Mucous membranes are dry.  Eyes:     Extraocular Movements: Extraocular movements intact.     Conjunctiva/sclera: Conjunctivae normal.     Pupils: Pupils are equal, round, and reactive to light.  Cardiovascular:     Rate and Rhythm: Normal rate and regular rhythm.     Pulses: Normal pulses.     Heart sounds: Normal heart sounds.  Pulmonary:     Effort: Pulmonary effort is normal.     Breath sounds: Normal breath sounds.  Abdominal:     General: Abdomen is flat. Bowel sounds are normal.     Palpations: Abdomen is soft.  Musculoskeletal:        General: Normal range of motion.     Cervical back: Normal range of motion and neck supple.  Skin:    General: Skin is  warm.     Capillary Refill: Capillary refill takes less than 2 seconds.  Neurological:     General: No focal deficit present.     Mental Status: She is alert and oriented to person, place, and time.  Psychiatric:        Mood and Affect: Mood normal.        Behavior: Behavior normal.     ED Results / Procedures / Treatments   Labs (all labs ordered are listed, but only abnormal results are displayed) Labs Reviewed  BASIC METABOLIC PANEL - Abnormal; Notable for the following components:      Result Value   Sodium 125 (*)    Chloride 92 (*)    Glucose, Bld 925 (*)    BUN 27 (*)    Creatinine, Ser 2.04 (*)    GFR, Estimated 27 (*)    All other components within normal limits  CBC - Abnormal; Notable for the following components:   WBC 11.9 (*)    All other components within normal limits  URINALYSIS, ROUTINE W REFLEX MICROSCOPIC - Abnormal; Notable for the following components:   Color, Urine STRAW (*)    Glucose, UA >=500 (*)    Leukocytes,Ua SMALL (*)    Bacteria, UA RARE (*)    All other components within normal limits  HEPATIC FUNCTION PANEL - Abnormal; Notable for the following components:   Albumin 3.4 (*)    Alkaline Phosphatase 187 (*)    All other components within normal limits  LIPASE, BLOOD - Abnormal; Notable for the following components:   Lipase 94 (*)    All other components within normal limits  CBG MONITORING, ED - Abnormal; Notable for the following components:   Glucose-Capillary >600 (*)    All other components within normal limits  I-STAT VENOUS BLOOD GAS, ED - Abnormal; Notable for the following components:   pO2, Ven 18 (*)    Sodium 130 (*)    All other components within normal limits  CBG MONITORING, ED - Abnormal; Notable for the following components:   Glucose-Capillary >600 (*)    All other components within normal limits  CBG MONITORING, ED - Abnormal; Notable for the following components:   Glucose-Capillary 581 (*)    All other  components within normal limits  CBG MONITORING, ED - Abnormal; Notable for the following components:   Glucose-Capillary 356 (*)    All other components within normal limits  BETA-HYDROXYBUTYRIC ACID  HEMOGLOBIN A1C  CBG MONITORING, ED    EKG EKG Interpretation  Date/Time:  Wednesday  April 03 2022 15:28:51 EDT Ventricular Rate:  67 PR Interval:  211 QRS Duration: 89 QT Interval:  413 QTC Calculation: 436 R Axis:   68 Text Interpretation: Sinus rhythm Biatrial enlargement No significant change since last tracing Confirmed by Jacalyn Lefevre [id number] on 04/03/2022 5:41:43 PM  Radiology US Abdomen Limited RUQ (LIVER/GB)  Result Date: 04/03/2022 CLINICAL DATA:  Abdominal pain. EXAM: ULTRASOUND ABDOMEN LIMITED RIGHT UPPER QUADRANT COMPARISON:  July 19, 2020. FINDINGS: Gallbladder: 6 mm gallstone is noted. No gallbladder wall thickening or pericholecystic fluid is noted. No sonographic Murphy's sign is noted. Common bile duct: Diameter: 3 mm which is within normal limits. Liver: No focal lesion identified. Within normal limits in parenchymal echogenicity. Portal vein is patent on color Doppler imaging with normal direction of blood flow towards the liver. Other: None. IMPRESSION: Cholelithiasis without evidence of cholecystitis. No other abnormality seen in the right upper quadrant of the abdomen. Electronically Signed   By: Lupita Raider M.D.   On: 04/03/2022 15:53   DG Chest Portable 1 View  Result Date: 04/03/2022 CLINICAL DATA:  Hyperglycemia EXAM: PORTABLE CHEST 1 VIEW COMPARISON:  08/08/2016 FINDINGS: Heart size upper limits of normal. Some aortic atherosclerotic calcification is noted. The lungs are clear. The vascularity is normal. No effusions. No significant bone finding. IMPRESSION: No active disease. Electronically Signed   By: Paulina Fusi M.D.   On: 04/03/2022 15:28    Procedures Procedures    Medications Ordered in ED Medications  sodium chloride 0.9 % bolus 1,000 mL  (1,000 mLs Intravenous New Bag/Given 04/03/22 1524)  sodium chloride 0.9 % bolus 1,000 mL (1,000 mLs Intravenous New Bag/Given 04/03/22 1524)  insulin aspart (novoLOG) injection 10 Units (10 Units Intravenous Given 04/03/22 1547)  insulin aspart (novoLOG) injection 10 Units (10 Units Intravenous Given 04/03/22 1654)  sodium chloride 0.9 % bolus 1,000 mL (1,000 mLs Intravenous New Bag/Given 04/03/22 1655)    ED Course/ Medical Decision Making/ A&P                           Medical Decision Making Amount and/or Complexity of Data Reviewed Labs: ordered. Radiology: ordered.  Risk Prescription drug management.   This patient presents to the ED for concern of hyperglycemia, this involves an extensive number of treatment options, and is a complaint that carries with it a high risk of complications and morbidity.  The differential diagnosis includes hyperosmolar state, dka, infection   Co morbidities that complicate the patient evaluation  DM, HTN, GERD, and high cholesterol   Additional history obtained:  Additional history obtained from epic chart review External records from outside source obtained and reviewed including husband   Lab Tests:  I Ordered, and personally interpreted labs.  The pertinent results include:  cbc with wbc slightly elevated at 11.9, bmp with glucose elevated at 925, Na is low at 125 (psedohyponatremia due to hyperglycemia); cr is 2.04 which is chronic; ua with > 500 glucose; vbg with ph 7.33; lip mildly elevated at 94, lfts nl   Imaging Studies ordered:  I ordered imaging studies including cxr and GB US I independently visualized and interpreted imaging which showed  CXR: IMPRESSION:  No active disease.  GB US:  IMPRESSION:  Cholelithiasis without evidence of cholecystitis.    No other abnormality seen in the right upper quadrant of the  abdomen.   I agree with the radiologist interpretation   Cardiac Monitoring:  The patient was maintained on  a  cardiac monitor.  I personally viewed and interpreted the cardiac monitored which showed an underlying rhythm of: nsr   Medicines ordered and prescription drug management:  I ordered medication including IVFs and IV insulin  for hyperglycemia  Reevaluation of the patient after these medicines showed that the patient improved I have reviewed the patients home medicines and have made adjustments as needed   Test Considered:  Gb US   Critical Interventions:  IVFs and IV insulin   Problem List / ED Course:  Hyperglycemia:  BS is much better.  She is nontoxic in appearance.  Metformin is contraindicated with GFR less than 30, so I will put her on Actos.  She has a monitor and strips at home.  She has not been eating a diabetic diet, but she knows what she's supposed to eat.  Pt is stable for d/c.  Return if worse.  Cholelithiasis:  She is not having any pain now.  We also talked about a gallbladder diet.  She wants to f/u with a Estate manager/land agent.  She is given a print out of the [place] results as she does not have "my chart."     Reevaluation:  After the interventions noted above, I reevaluated the patient and found that they have :improved   Social Determinants of Health:  Lives at home   Dispostion:  After consideration of the diagnostic results and the patients response to treatment, I feel that the patent would benefit from discharge with outpatient f/u.          Final Clinical Impression(s) / ED Diagnoses Final diagnoses:  Calculus of gallbladder without cholecystitis without obstruction  Poorly controlled type 2 diabetes mellitus (HCC)    Rx / DC Orders ED Discharge Orders          Ordered    pioglitazone (ACTOS) 15 MG tablet  Daily        04/03/22 1741              Jacalyn Lefevre, MD 04/03/22 1745

## 2022-04-03 NOTE — ED Triage Notes (Signed)
Patient sent to ED from [place] after bloodwork today shows glucose of 674 and creatinine of 2.2. Patient states she was on metformin but was discontinued over one year ago because he blood sugars were well controlled. Patient denies dizziness, denies blurred vision, is alert, oriented, ambulatory, and in no apparent distress at this time.

## 2022-04-03 NOTE — ED Provider Triage Note (Signed)
Emergency Medicine Provider Triage Evaluation Note  Lisa Blanchard , a 61 y.o. female  was evaluated in triage.  Pt complains of hyperglycemia. States that she went to her PCP earlier today for routine checkup and was found to have a blood sugar reading of "high".  Was sent here for management of same.  She states that she was on metformin up until about a year ago where she was taken off of it due to normal blood sugar readings.  She states that she has felt slight dizziness over the past few months, however she thought that that was just related to her needing new glasses.  Denies any nausea, vomiting, diarrhea, or abdominal pain.  Review of Systems  Positive:  Negative:   Physical Exam  BP 122/87 (BP Location: Right Arm)   Pulse 77   Temp 98.5 F (36.9 C) (Oral)   Resp 15   SpO2 95%  Gen:   Awake, no distress   Resp:  Normal effort  MSK:   Moves extremities without difficulty  Other:    Medical Decision Making  Medically screening exam initiated at 1:44 PM.  Appropriate orders placed.  Channelle Bottger was informed that the remainder of the evaluation will be completed by another provider, this initial triage assessment does not replace that evaluation, and the importance of remaining in the ED until their evaluation is complete.  CBG greater than 600, work-up initiated   Vear Clock 04/03/22 1349

## 2022-04-04 LAB — HEMOGLOBIN A1C
Hgb A1c MFr Bld: >15.5 % — ABNORMAL HIGH (ref 4.8–5.6)
Mean Plasma Glucose: >398 mg/dL
